# Patient Record
Sex: Female | Born: 1935 | Race: White | Hispanic: No | Smoking: Former smoker
Health system: Southern US, Community
[De-identification: ages and names within clinical notes are randomized; demographics above are authoritative.]

## PROBLEM LIST (undated history)

## (undated) DIAGNOSIS — I350 Nonrheumatic aortic (valve) stenosis: Secondary | ICD-10-CM

## (undated) DIAGNOSIS — G4730 Sleep apnea, unspecified: Secondary | ICD-10-CM

## (undated) DIAGNOSIS — R350 Frequency of micturition: Secondary | ICD-10-CM

## (undated) DIAGNOSIS — R32 Unspecified urinary incontinence: Secondary | ICD-10-CM

## (undated) DIAGNOSIS — L309 Dermatitis, unspecified: Secondary | ICD-10-CM

## (undated) DIAGNOSIS — F32A Depression, unspecified: Secondary | ICD-10-CM

## (undated) DIAGNOSIS — R351 Nocturia: Secondary | ICD-10-CM

## (undated) DIAGNOSIS — M2550 Pain in unspecified joint: Secondary | ICD-10-CM

## (undated) DIAGNOSIS — R3915 Urgency of urination: Secondary | ICD-10-CM

## (undated) DIAGNOSIS — L409 Psoriasis, unspecified: Secondary | ICD-10-CM

## (undated) DIAGNOSIS — I10 Essential (primary) hypertension: Secondary | ICD-10-CM

## (undated) DIAGNOSIS — M1990 Unspecified osteoarthritis, unspecified site: Secondary | ICD-10-CM

## (undated) DIAGNOSIS — K259 Gastric ulcer, unspecified as acute or chronic, without hemorrhage or perforation: Secondary | ICD-10-CM

## (undated) DIAGNOSIS — S2232XA Fracture of one rib, left side, initial encounter for closed fracture: Secondary | ICD-10-CM

## (undated) DIAGNOSIS — R0600 Dyspnea, unspecified: Secondary | ICD-10-CM

## (undated) DIAGNOSIS — F329 Major depressive disorder, single episode, unspecified: Secondary | ICD-10-CM

## (undated) DIAGNOSIS — M2540 Effusion, unspecified joint: Secondary | ICD-10-CM

## (undated) HISTORY — DX: Morbid (severe) obesity due to excess calories: E66.01

## (undated) HISTORY — DX: Fracture of one rib, left side, initial encounter for closed fracture: S22.32XA

## (undated) HISTORY — DX: Dyspnea, unspecified: R06.00

## (undated) HISTORY — PX: ESOPHAGOGASTRODUODENOSCOPY: SHX1529

## (undated) HISTORY — DX: Nonrheumatic aortic (valve) stenosis: I35.0

## (undated) HISTORY — DX: Major depressive disorder, single episode, unspecified: F32.9

## (undated) HISTORY — PX: OTHER SURGICAL HISTORY: SHX169

## (undated) HISTORY — PX: COLONOSCOPY: SHX174

## (undated) HISTORY — DX: Depression, unspecified: F32.A

## (undated) HISTORY — PX: DENTAL SURGERY: SHX609

## (undated) HISTORY — PX: TONSILLECTOMY: SUR1361

## (undated) HISTORY — DX: Unspecified osteoarthritis, unspecified site: M19.90

## (undated) HISTORY — PX: JOINT REPLACEMENT: SHX530

---

## 2010-04-07 ENCOUNTER — Emergency Department (HOSPITAL_COMMUNITY): Admission: EM | Admit: 2010-04-07 | Discharge: 2010-04-07 | Payer: Self-pay | Admitting: Emergency Medicine

## 2010-07-30 ENCOUNTER — Encounter: Payer: Self-pay | Admitting: Cardiology

## 2010-09-05 ENCOUNTER — Encounter: Payer: Self-pay | Admitting: Cardiology

## 2010-09-16 DIAGNOSIS — R5383 Other fatigue: Secondary | ICD-10-CM

## 2010-09-16 DIAGNOSIS — H9190 Unspecified hearing loss, unspecified ear: Secondary | ICD-10-CM

## 2010-09-16 DIAGNOSIS — R011 Cardiac murmur, unspecified: Secondary | ICD-10-CM | POA: Insufficient documentation

## 2010-09-16 DIAGNOSIS — S81809A Unspecified open wound, unspecified lower leg, initial encounter: Secondary | ICD-10-CM

## 2010-09-16 DIAGNOSIS — M1990 Unspecified osteoarthritis, unspecified site: Secondary | ICD-10-CM | POA: Insufficient documentation

## 2010-09-16 DIAGNOSIS — R5381 Other malaise: Secondary | ICD-10-CM

## 2010-09-16 DIAGNOSIS — M129 Arthropathy, unspecified: Secondary | ICD-10-CM | POA: Insufficient documentation

## 2010-09-16 DIAGNOSIS — E559 Vitamin D deficiency, unspecified: Secondary | ICD-10-CM | POA: Insufficient documentation

## 2010-09-16 DIAGNOSIS — S81009A Unspecified open wound, unspecified knee, initial encounter: Secondary | ICD-10-CM | POA: Insufficient documentation

## 2010-09-16 DIAGNOSIS — K5900 Constipation, unspecified: Secondary | ICD-10-CM | POA: Insufficient documentation

## 2010-09-16 DIAGNOSIS — S91009A Unspecified open wound, unspecified ankle, initial encounter: Secondary | ICD-10-CM

## 2010-09-16 DIAGNOSIS — R12 Heartburn: Secondary | ICD-10-CM

## 2010-09-17 ENCOUNTER — Encounter: Payer: Self-pay | Admitting: Cardiology

## 2010-09-17 ENCOUNTER — Ambulatory Visit: Payer: Self-pay | Admitting: Cardiology

## 2010-09-17 DIAGNOSIS — I359 Nonrheumatic aortic valve disorder, unspecified: Secondary | ICD-10-CM | POA: Insufficient documentation

## 2010-09-30 ENCOUNTER — Ambulatory Visit: Payer: Self-pay

## 2010-09-30 ENCOUNTER — Encounter: Payer: Self-pay | Admitting: Cardiology

## 2010-09-30 ENCOUNTER — Ambulatory Visit (HOSPITAL_COMMUNITY)
Admission: RE | Admit: 2010-09-30 | Discharge: 2010-09-30 | Payer: Self-pay | Source: Home / Self Care | Attending: Cardiology | Admitting: Cardiology

## 2010-10-19 HISTORY — PX: OTHER SURGICAL HISTORY: SHX169

## 2010-10-21 ENCOUNTER — Ambulatory Visit
Admission: RE | Admit: 2010-10-21 | Discharge: 2010-10-21 | Payer: Self-pay | Source: Home / Self Care | Attending: Cardiology | Admitting: Cardiology

## 2010-10-21 ENCOUNTER — Encounter: Payer: Self-pay | Admitting: Cardiology

## 2010-10-21 ENCOUNTER — Encounter (INDEPENDENT_AMBULATORY_CARE_PROVIDER_SITE_OTHER): Payer: Self-pay | Admitting: *Deleted

## 2010-10-22 ENCOUNTER — Ambulatory Visit: Admit: 2010-10-22 | Payer: Self-pay | Admitting: Cardiology

## 2010-10-22 ENCOUNTER — Encounter: Admission: RE | Admit: 2010-10-22 | Payer: Self-pay | Source: Home / Self Care | Admitting: Orthopedic Surgery

## 2010-10-23 ENCOUNTER — Ambulatory Visit
Admission: RE | Admit: 2010-10-23 | Payer: Self-pay | Source: Home / Self Care | Attending: Cardiology | Admitting: Cardiology

## 2010-10-23 LAB — BASIC METABOLIC PANEL
BUN: 14 mg/dL (ref 6–23)
CO2: 29 mEq/L (ref 19–32)
Calcium: 9 mg/dL (ref 8.4–10.5)
Chloride: 104 mEq/L (ref 96–112)
Creatinine, Ser: 0.74 mg/dL (ref 0.4–1.2)
GFR calc Af Amer: >60 mL/min (ref >60)
GFR calc non Af Amer: >60 mL/min (ref >60)
Glucose, Bld: 98 mg/dL (ref 70–99)
Potassium: 4.2 mEq/L (ref 3.5–5.1)
Sodium: 142 mEq/L (ref 135–145)

## 2010-10-23 LAB — POCT I-STAT 3, VENOUS BLOOD GAS (G3P V)
Acid-Base Excess: 2 mmol/L (ref 0.0–2.0)
Bicarbonate: 29 mEq/L — ABNORMAL HIGH (ref 20.0–24.0)
O2 Saturation: 63 %
TCO2: 31 mmol/L (ref 0–100)
pCO2, Ven: 52.2 mmHg — ABNORMAL HIGH (ref 45.0–50.0)
pH, Ven: 7.352 — ABNORMAL HIGH (ref 7.250–7.300)
pO2, Ven: 35 mmHg (ref 30.0–45.0)

## 2010-10-23 LAB — PROTIME-INR
INR: 0.96 (ref 0.00–1.49)
Prothrombin Time: 13 seconds (ref 11.6–15.2)

## 2010-10-23 LAB — POCT I-STAT 3, ART BLOOD GAS (G3+)
Acid-Base Excess: 3 mmol/L — ABNORMAL HIGH (ref 0.0–2.0)
Bicarbonate: 28.6 mEq/L — ABNORMAL HIGH (ref 20.0–24.0)
O2 Saturation: 92 %
TCO2: 30 mmol/L (ref 0–100)
pCO2 arterial: 47.5 mmHg — ABNORMAL HIGH (ref 35.0–45.0)
pH, Arterial: 7.388 (ref 7.350–7.400)
pO2, Arterial: 64 mmHg — ABNORMAL LOW (ref 80.0–100.0)

## 2010-10-23 LAB — CBC
HCT: 47.9 % — ABNORMAL HIGH (ref 36.0–46.0)
Hemoglobin: 16.1 g/dL — ABNORMAL HIGH (ref 12.0–15.0)
MCH: 32.1 pg (ref 26.0–34.0)
MCHC: 33.6 g/dL (ref 30.0–36.0)
MCV: 95.4 fL (ref 78.0–100.0)
Platelets: 181 10*3/uL (ref 150–400)
RBC: 5.02 MIL/uL (ref 3.87–5.11)
RDW: 13.7 % (ref 11.5–15.5)
WBC: 8 10*3/uL (ref 4.0–10.5)

## 2010-10-28 ENCOUNTER — Ambulatory Visit
Admission: RE | Admit: 2010-10-28 | Discharge: 2010-10-28 | Payer: Self-pay | Source: Home / Self Care | Attending: Thoracic Surgery (Cardiothoracic Vascular Surgery) | Admitting: Thoracic Surgery (Cardiothoracic Vascular Surgery)

## 2010-11-06 ENCOUNTER — Encounter
Admission: AD | Admit: 2010-11-06 | Discharge: 2010-11-06 | Payer: Self-pay | Source: Home / Self Care | Attending: Dentistry | Admitting: Dentistry

## 2010-11-06 ENCOUNTER — Encounter: Payer: Self-pay | Admitting: Cardiology

## 2010-11-10 ENCOUNTER — Telehealth (INDEPENDENT_AMBULATORY_CARE_PROVIDER_SITE_OTHER): Payer: Self-pay | Admitting: *Deleted

## 2010-11-10 ENCOUNTER — Inpatient Hospital Stay: Admission: RE | Admit: 2010-11-10 | Payer: Self-pay | Source: Home / Self Care | Admitting: Orthopedic Surgery

## 2010-11-11 ENCOUNTER — Ambulatory Visit (HOSPITAL_COMMUNITY)
Admission: RE | Admit: 2010-11-11 | Discharge: 2010-11-11 | Payer: Self-pay | Source: Home / Self Care | Attending: Dentistry | Admitting: Dentistry

## 2010-11-11 LAB — CBC
HCT: 43.8 % (ref 36.0–46.0)
Hemoglobin: 14.5 g/dL (ref 12.0–15.0)
MCH: 31.5 pg (ref 26.0–34.0)
MCHC: 33.1 g/dL (ref 30.0–36.0)
MCV: 95 fL (ref 78.0–100.0)
Platelets: 180 10*3/uL (ref 150–400)
RBC: 4.61 MIL/uL (ref 3.87–5.11)
RDW: 13.5 % (ref 11.5–15.5)
WBC: 6.6 10*3/uL (ref 4.0–10.5)

## 2010-11-11 LAB — BASIC METABOLIC PANEL
BUN: 15 mg/dL (ref 6–23)
CO2: 30 mEq/L (ref 19–32)
Calcium: 8.9 mg/dL (ref 8.4–10.5)
Chloride: 102 mEq/L (ref 96–112)
Creatinine, Ser: 0.85 mg/dL (ref 0.4–1.2)
GFR calc Af Amer: >60 mL/min (ref >60)
GFR calc non Af Amer: >60 mL/min (ref >60)
Glucose, Bld: 94 mg/dL (ref 70–99)
Potassium: 4.5 mEq/L (ref 3.5–5.1)
Sodium: 141 mEq/L (ref 135–145)

## 2010-11-11 NOTE — Op Note (Addendum)
NAMESUKHMAN, KOCHER               ACCOUNT NO.:  [id number]  MEDICAL RECORD NO.:  [id number]          PATIENT TYPE:  AMB  LOCATION:  SDS                          FACILITY:  [facility]  PHYSICIAN:  Charlynne Pander, D.D.S.DATE OF BIRTH:  [birth year]  DATE OF PROCEDURE:  11/11/2010 DATE OF DISCHARGE:                              OPERATIVE REPORT   PREOPERATIVE DIAGNOSES: 1. Critical aortic stenosis. 2. Preaortic valve replacement dental protocol. 3. Chronic periodontitis. 4. Multiple retained root segments. 5. Apical periodontitis.  POSTOPERATIVE DIAGNOSES: 1. Critical aortic stenosis. 2. Preaortic valve replacement dental protocol. 3. Chronic periodontitis. 4. Multiple retained root segments. 5. Apical periodontitis. 6. Bilateral mandibular lingual tori.  OPERATIONS: 1. Extraction of remaining teeth (tooth numbers 3, 5, 6, 7,10, 11, 12,     17, 18, 19, 20, 21, 22, 27, 28, 29, 30, 31, and 32). 2. Four quadrants alveoloplasty. 3. Bilateral mandibular lingual tori reductions.  SURGEON:  Charlynne Pander, D.D.S.  ASSISTANT:  Public house manager (Sales executive).  ANESTHESIA:  Monitored anesthesia care per the Anesthesia Team Dr. Dahlia Client attending.  MEDICATIONS: 1. Ancef 1 g IV prior to invasive dental procedures. 2. Local anesthesia with a total utilization of four carpules each     containing 34 mg of lidocaine with 0.017 mg of epinephrine as well     as three carpules each containing 9 mg of bupivacaine with 0.009 mg     of epinephrine.  SPECIMENS:  There were 19 teeth that were discarded.  DRAINS:  None.  CULTURES:  None.  ESTIMATED BLOOD LOSS:  Less than 100 mL.  FLUIDS:  500 mL of lactated Ringer solution.  COMPLICATIONS:  None.  INDICATIONS:  The patient was previously diagnosed with critical aortic stenosis with anticipated aortic valve replacement.  The patient was seen as part of her preaortic valve replacement dental protocol evaluation.  The  patient was examined and treatment planned for extraction of remaining teeth with alveoloplasty and preprosthetic surgery as indicated.  This treatment plan was formulated to decrease the risk and complications associated with dental infection from affecting the patient's systemic health and anticipated heart valve surgery.  OPERATIVE FINDINGS:  The patient was examined in operating room #32 in the neuro surgical suite.  The teeth were identified for extraction. The patient noted to be affected by chronic periodontitis, apical periodontitis, multiple retained root segments in the presence of bilateral mandibular lingual tori.  DESCRIPTION OF PROCEDURE:  The patient was brought to the main operating room #32.  The patient was then placed in supine position on the operating room table.  Monitored anesthesia care was then induced by the anesthesia team.  The area was then prepped and draped in the usual manner for dental medicine procedure.  Time-out was performed.  The patient was identified, procedures were verified.  The oral cavity was then thoroughly examined with findings noted above.  The patient was then ready for the dental medicine procedure as follows:  Local anesthesia was administered sequentially with a total utilization of four carpules each containing 34 mg of lidocaine with 0.017 mg of epinephrine as well as three carpules each containing 9  mg of bupivacaine with 0.009 mg of epinephrine.  Maxillary left and right quadrants were first approached.  Infiltration was then achieved utilizing the lidocaine with epinephrine on the facial and palatal aspects as indicated.  The maxillary right quadrant was then approached, 15 blade incision was then made from the distal #2 and extended to the distal of #8.  Surgical flap was then carefully reflected.  The teeth were then subluxated with a series of straight elevators.  Tooth #3 was then removed with a 53R forceps without  complication.  Tooth numbers 5, 6, and 7 were then removed with 150 forceps without complications.  Alveoplasty was then performed utilizing rongeurs and bone file.  Tissues were approximated and trimmed appropriately.  The surgical site was then irrigated with copious amounts of sterile saline.  The surgical site was then closed in the distal #2 and extended to the mesial #8 utilizing 3-0 chromic gut suture in a continuous interrupted suture technique x1.  At this point in time, the maxillary left quadrant was approached, 15 blade incision was then made from the distal #13 extended to the mesial #9.  Surgical flap was then carefully reflected.  Teeth were subluxated with a series of straight elevators.  Tooth numbers 10, 11, and 12 were then removed with a 150 forceps without complications.  Alveoplasty was then performed utilizing rongeurs and bone file.  The tissues were approximated and trimmed appropriately.  Surgical site was then irrigated with copious amounts of sterile saline.  The surgical site was then closed from distal #13 and extended to the mesial #9 utilizing 3-0 chromic gut suture in a continuous interrupted suture technique x1.  At this point in time, the mandibular quadrants were approached.  The patient was given bilateral inferior alveolar nerve blocks utilizing the bupivacaine with epinephrine.  Further infiltration was then achieved utilizing the bupivacaine with epinephrine.  At this point in time, a 15 blade incision was then made from the distal of #32 and extended to the mesial of #22.  Surgical flap was then carefully reflected.  Appropriate amounts of buccal and interseptal bone was removed with the surgical handpiece and bur, and copious amounts of sterile saline.  Teeth were then subluxated with a series of straight elevators.  Tooth #32 was then removed with a #23 forceps without complications.  The roots associated with tooth numbers 29, 30, and 31 were  then removed with a 151 forceps without complications.  Tooth numbers 27 and 28 were then removed with a 151 forceps without complications.  Alveoplasty was then performed utilizing rongeurs and bone file.  At this point in time, mandibular torus was noted and this was removed with a surgical handpiece and bur, and copious amounts of sterile saline.  Further alveoloplasty was then achieved utilizing rongeurs and bone file.  The tissues were approximated and trimmed appropriately.  Surgical site was then irrigated with copious amounts of sterile saline x2.  The surgical site was then closed from distal #32 and extended to the mesial of #25 utilizing 3-0 chromic gut suture in a continuous interrupted suture technique x1.  At this point in time, the mandibular left quadrant was approached.  A 15 blade incision was then made from the distal #17, extended to the mesial #22, surgical flap was then further reflected.  Appropriate amounts of buccal and interseptal bone was removed with the surgical handpiece and bur, and copious amounts of sterile saline.  Teeth were then subluxated with a series of straight elevators.  Tooth numbers 22, 21, and 20 were then removed with a 151 forceps without complications. Tooth #17 and #19 were then removed with a 17 forceps without complications.  The retained root in the area of #18 was then removed with rongeurs without complication.  Alveoloplasty was then performed utilizing rongeurs and bone file.  The mandibular left torus was then removed with a surgical handpiece and bur, and copious amounts of sterile saline.  Further alveoplasty was then performed utilizing rongeurs and bone file.  Tissues were approximated and trimmed appropriately.  The surgical site was then irrigated with copious amounts of sterile saline x2.  The surgical site was then closed from the distal #17, extended to the mesial #20 for utilizing 3-0 chromic gut suture in a continuous  interrupted suture technique x1.  At this point in time, the entire mouth was irrigated with copious amounts of sterile saline.  The patient was examined for complications, seeing none, dental medicine procedure was deemed to be complete.  A series of 4 x 4 gauzes were placed to the mouth to aid hemostasis.  The patient was then handed over the anesthesia team for final disposition. After appropriate amount of time, the patient was taken to the postanesthesia care unit with stable vital signs and good oxygenation level.  All counts were correct for the dental medicine procedure.  The patient will be seen in approximately 1 week for evaluation for suture removal.  Aortic valve replacement surgery will be scheduled at the discretion of Dr. Dorris Fetch.  The patient was given appropriate pain medication utilizing Percocet 5/325, one to two tablets every 6 hours as needed for pain.     Charlynne Pander, D.D.S.     RFK/MEDQ  D:  11/11/2010  T:  11/11/2010  Job:  [id number]  cc:   Salvatore Decent. Dorris Fetch, M.D. Madolyn Frieze Jens Som, MD, [facility] Madlyn Frankel. Charlann Boxer, M.D.  Electronically Signed by Cindra Eves D.D.S. on 11/11/2010 11:32:06 AM

## 2010-11-13 NOTE — Procedures (Addendum)
  NAMEVEE, BAHE               ACCOUNT NO.:  [id number]  MEDICAL RECORD NO.:  [id number]          PATIENT TYPE:  OIB  LOCATION:  1963                         FACILITY:  [facility]  PHYSICIAN:  Rollene Rotunda, MD, FACCDATE OF BIRTH:  12-Jan-1935  DATE OF PROCEDURE:  10/23/2010 DATE OF DISCHARGE:  10/23/2010                           CARDIAC CATHETERIZATION   PRIMARY CARE DOCTOR:  Dr. Charlesetta Shanks.  CARDIOLOGIST:  Madolyn Frieze. Jens Som, MD, FACC  SURGEON:  Rollene Rotunda, MD, [facility]  REASON FOR CATHETERIZATION:  Evaluate the patient with severe aortic stenosis.  PROCEDURE NOTE:  Left and right heart catheterization was performed via the right femoral artery.  The vessel was cannulated using anterior wall puncture.  A #5-French arterial sheath and a #7-French venous sheath were inserted via the modified Seldinger technique.  Preformed Judkins and pigtail catheter were utilized.  The patient tolerated the procedure well and left the lab in stable condition.  RESULTS:  RA mean 5, RV 29/2, PA August 28/9 with a mean of 18, pulmonary wedge pressure mean 6, LV 195/22, AO 156/75, cardiac output/cardiac index 4/2, mean gradient 34.97, aortic valve area 0.58.  Coronaries:  Left main was normal.  The LAD had luminal irregularities. There was mid diagonal which was large and normal.  Circumflex in the AV groove was normal.  The first obtuse marginal was small and normal. Second obtuse marginal was moderate sized and normal.  The right coronary artery was a large dominant vessel with mild luminal irregularities.  PDA was moderate sized and normal.  The left ventriculogram was obtained in the RAO projection.  The EF was 65% and normal.  CONCLUSION: 1. Severe aortic stenosis. 2. Minimal coronary plaque. 3. Normal left ventricular function.  PLAN:  The patient will be referred for aortic valve replacement.     Rollene Rotunda, MD, [facility]     JH/MEDQ  D:  10/23/2010  T:  10/23/2010  Job:   [id number]  cc:   Lynann Bologna Jens Som, MD, [facility]  Electronically Signed by Rollene Rotunda MD [facility] on 11/13/2010 12:20:24 PM

## 2010-11-17 ENCOUNTER — Ambulatory Visit
Admission: RE | Admit: 2010-11-17 | Discharge: 2010-11-17 | Payer: Self-pay | Source: Home / Self Care | Attending: Thoracic Surgery (Cardiothoracic Vascular Surgery) | Admitting: Thoracic Surgery (Cardiothoracic Vascular Surgery)

## 2010-11-17 ENCOUNTER — Telehealth (INDEPENDENT_AMBULATORY_CARE_PROVIDER_SITE_OTHER): Payer: Self-pay | Admitting: *Deleted

## 2010-11-17 LAB — COMPREHENSIVE METABOLIC PANEL
ALT: 27 U/L (ref 0–35)
AST: 30 U/L (ref 0–37)
Albumin: 3.8 g/dL (ref 3.5–5.2)
Alkaline Phosphatase: 61 U/L (ref 39–117)
BUN: 15 mg/dL (ref 6–23)
CO2: 21 mEq/L (ref 19–32)
Calcium: 9 mg/dL (ref 8.4–10.5)
Chloride: 102 mEq/L (ref 96–112)
Creatinine, Ser: 0.92 mg/dL (ref 0.4–1.2)
GFR calc Af Amer: >60 mL/min (ref >60)
GFR calc non Af Amer: 60 mL/min — ABNORMAL LOW (ref >60)
Glucose, Bld: 102 mg/dL — ABNORMAL HIGH (ref 70–99)
Potassium: 4 mEq/L (ref 3.5–5.1)
Sodium: 137 mEq/L (ref 135–145)
Total Bilirubin: 1 mg/dL (ref 0.3–1.2)
Total Protein: 7.6 g/dL (ref 6.0–8.3)

## 2010-11-17 LAB — URINALYSIS, ROUTINE W REFLEX MICROSCOPIC
Hgb urine dipstick: NEGATIVE
Ketones, ur: 15 mg/dL — AB
Leukocytes, UA: NEGATIVE
Nitrite: NEGATIVE
Protein, ur: 30 mg/dL — AB
Specific Gravity, Urine: 1.024 (ref 1.005–1.030)
Urine Glucose, Fasting: NEGATIVE mg/dL
Urobilinogen, UA: 1 mg/dL (ref 0.0–1.0)
pH: 5.5 (ref 5.0–8.0)

## 2010-11-17 LAB — BLOOD GAS, ARTERIAL
Acid-Base Excess: 1.3 mmol/L (ref 0.0–2.0)
Bicarbonate: 25.3 mEq/L — ABNORMAL HIGH (ref 20.0–24.0)
Drawn by: 181601
FIO2: 0.21 %
O2 Saturation: 96.8 %
Patient temperature: 98.6
TCO2: 26.5 mmol/L (ref 0–100)
pCO2 arterial: 39.4 mmHg (ref 35.0–45.0)
pH, Arterial: 7.423 — ABNORMAL HIGH (ref 7.350–7.400)
pO2, Arterial: 78 mmHg — ABNORMAL LOW (ref 80.0–100.0)

## 2010-11-17 LAB — CBC
HCT: 44.9 % (ref 36.0–46.0)
Hemoglobin: 15.1 g/dL — ABNORMAL HIGH (ref 12.0–15.0)
MCH: 31.1 pg (ref 26.0–34.0)
MCHC: 33.6 g/dL (ref 30.0–36.0)
MCV: 92.6 fL (ref 78.0–100.0)
Platelets: 205 10*3/uL (ref 150–400)
RBC: 4.85 MIL/uL (ref 3.87–5.11)
RDW: 13.5 % (ref 11.5–15.5)
WBC: 7.9 10*3/uL (ref 4.0–10.5)

## 2010-11-17 LAB — URINE MICROSCOPIC-ADD ON

## 2010-11-17 LAB — PROTIME-INR
INR: 1.04 (ref 0.00–1.49)
Prothrombin Time: 13.8 seconds (ref 11.6–15.2)

## 2010-11-17 LAB — TYPE AND SCREEN
ABO/RH(D): O POS
Antibody Screen: NEGATIVE

## 2010-11-17 LAB — HEMOGLOBIN A1C
Hgb A1c MFr Bld: 5.3 % (ref <5.7)
Mean Plasma Glucose: 105 mg/dL (ref <117)

## 2010-11-17 LAB — APTT: aPTT: 24 seconds (ref 24–37)

## 2010-11-17 LAB — SURGICAL PCR SCREEN
MRSA, PCR: NEGATIVE
Staphylococcus aureus: POSITIVE — AB

## 2010-11-17 LAB — ABO/RH: ABO/RH(D): O POS

## 2010-11-18 NOTE — Letter (Signed)
Summary: [name] - History & Physical  [facility] - History & Physical   Imported By: Marylou Mccoy 09/23/2010 15:06:34  _____________________________________________________________________  External Attachment:    Type:   Image     Comment:   External Document

## 2010-11-18 NOTE — Assessment & Plan Note (Signed)
Summary: np6/heart murmur-mb   CC:  referal from Dr. Celene Skeen.  History of Present Illness: 76 year old female for evaluation of murmur. No prior cardiac history. Pt denies chest pain, orthopnea, PND, palpitations or syncope. She has mild dyspnea with more extreme activities relieved with rest. There is occasional mild pedal edema. We are asked to evaluate for murmur.  Current Medications (verified): 1)  None  Allergies: 1)  ! * Bee Stings  Past History:  Past Medical History: VITAMIN D DEFICIENCY OSTEOARTHRITIS  MORBID OBESITY Fracture of left rib  Past Surgical History: Cataract Tonsillectomy Ovary removed secondary to cyst  Family History: Reviewed history from 09/16/2010 and no changes required. Cancer DM No premature CAD  Social History: Reviewed history from 09/16/2010 and no changes required. Retired  Tobacco Use - Former.  Alcohol Use - yes Regular Exercise - yes Widowed   Review of Systems       Knee Arthralgias butno fevers or chills, productive cough, hemoptysis, dysphasia, odynophagia, melena, hematochezia, dysuria, hematuria, rash, seizure activity, orthopnea, PND, pedal edema, claudication. Remaining systems are negative.   Vital Signs:  Patient profile:   76 year old female Height:      62 inches Weight:      220 pounds BMI:     40.38 Pulse rate:   89 / minute Resp:     14 per minute BP sitting:   134 / 83  (left arm)  Vitals Entered By: Kem Parkinson (September 17, 2010 2:47 PM)  Physical Exam  General:  Well developed/obese in NAD Skin warm/dry Patient not depressed No peripheral clubbing Back-normal HEENT-normal/normal eyelids Neck supple/normal carotid upstroke bilaterally; no bruits; no JVD; no thyromegaly chest - CTA/ normal expansion CV - RRR/normal S1 and S2; no  rubs or gallops;  PMI nondisplaced; 2/6 systolic murmur left sternal border. S2 is not diminished. Abdomen -NT/ND, no HSM, no mass, + bowel sounds, no bruit 2+  femoral pulses, no bruits Ext-no edema, chords, 2+ DP; Varicosities noted. Neuro-grossly nonfocal     EKG  Procedure date:  09/17/2010  Findings:      Sinus rhythm with first degree AV block.  Impression & Recommendations:  Problem # 1:  MURMUR (ICD-785.2) Patient sounds to have aortic stenosis murmur although mild. She was instructed on symptoms of dyspnea, chest pain syncope for future reference.. Orders: Echocardiogram (Echo)  Problem # 2:  MORBID OBESITY (ICD-278.01)  Problem # 3:  AORTIC VALVE DISORDERS (ICD-424.1) As per #1.  Patient Instructions: 1)  Your physician wants you to follow-up in:ONE YEAR   You will receive a reminder letter in the mail two months in advance. If you don't receive a letter, please call our office to schedule the follow-up appointment. 2)  Your physician has requested that you have an echocardiogram.  Echocardiography is a painless test that uses sound waves to create images of your heart. It provides your doctor with information about the size and shape of your heart and how well your heart's chambers and valves are working.  This procedure takes approximately one hour. There are no restrictions for this procedure.

## 2010-11-19 ENCOUNTER — Inpatient Hospital Stay (HOSPITAL_COMMUNITY): Payer: Medicare HMO

## 2010-11-19 ENCOUNTER — Other Ambulatory Visit: Payer: Self-pay | Admitting: Thoracic Surgery (Cardiothoracic Vascular Surgery)

## 2010-11-19 ENCOUNTER — Inpatient Hospital Stay (HOSPITAL_COMMUNITY)
Admission: RE | Admit: 2010-11-19 | Discharge: 2010-11-24 | DRG: 220 | Disposition: A | Discharge status: Home / Self Care | Payer: Medicare HMO | Source: Ambulatory Visit | Attending: Thoracic Surgery (Cardiothoracic Vascular Surgery) | Admitting: Thoracic Surgery (Cardiothoracic Vascular Surgery)

## 2010-11-19 DIAGNOSIS — Z7982 Long term (current) use of aspirin: Secondary | ICD-10-CM

## 2010-11-19 DIAGNOSIS — I359 Nonrheumatic aortic valve disorder, unspecified: Principal | ICD-10-CM | POA: Diagnosis present

## 2010-11-19 DIAGNOSIS — E559 Vitamin D deficiency, unspecified: Secondary | ICD-10-CM | POA: Diagnosis present

## 2010-11-19 DIAGNOSIS — D696 Thrombocytopenia, unspecified: Secondary | ICD-10-CM | POA: Diagnosis not present

## 2010-11-19 DIAGNOSIS — E8779 Other fluid overload: Secondary | ICD-10-CM | POA: Diagnosis not present

## 2010-11-19 DIAGNOSIS — M1990 Unspecified osteoarthritis, unspecified site: Secondary | ICD-10-CM | POA: Diagnosis present

## 2010-11-19 DIAGNOSIS — D62 Acute posthemorrhagic anemia: Secondary | ICD-10-CM | POA: Diagnosis not present

## 2010-11-19 HISTORY — PX: ANOMALOUS PULMONARY VENOUS RETURN REPAIR: SHX255

## 2010-11-19 LAB — POCT I-STAT 3, ART BLOOD GAS (G3+)
Acid-base deficit: 2 mmol/L (ref 0.0–2.0)
Bicarbonate: 22.9 mEq/L (ref 20.0–24.0)
O2 Saturation: 96 %
O2 Saturation: 97 %
TCO2: 21 mmol/L (ref 0–100)
pCO2 arterial: 32.9 mmHg — ABNORMAL LOW (ref 35.0–45.0)
pCO2 arterial: 44.7 mmHg (ref 35.0–45.0)
pCO2 arterial: 40.8 mmHg (ref 35.0–45.0)
pH, Arterial: 7.45 — ABNORMAL HIGH (ref 7.350–7.400)
pH, Arterial: 7.283 — ABNORMAL LOW (ref 7.350–7.400)
pO2, Arterial: 106 mmHg — ABNORMAL HIGH (ref 80.0–100.0)

## 2010-11-19 LAB — POCT I-STAT 4, (NA,K, GLUC, HGB,HCT)
Glucose, Bld: 111 mg/dL — ABNORMAL HIGH (ref 70–99)
Glucose, Bld: 100 mg/dL — ABNORMAL HIGH (ref 70–99)
Glucose, Bld: 82 mg/dL (ref 70–99)
Glucose, Bld: 93 mg/dL (ref 70–99)
Glucose, Bld: 109 mg/dL — ABNORMAL HIGH (ref 70–99)
HCT: 37 % (ref 36.0–46.0)
HCT: 34 % — ABNORMAL LOW (ref 36.0–46.0)
HCT: 26 % — ABNORMAL LOW (ref 36.0–46.0)
HCT: 24 % — ABNORMAL LOW (ref 36.0–46.0)
Hemoglobin: 12.6 g/dL (ref 12.0–15.0)
Hemoglobin: 8.5 g/dL — ABNORMAL LOW (ref 12.0–15.0)
Hemoglobin: 8.8 g/dL — ABNORMAL LOW (ref 12.0–15.0)
Hemoglobin: 8.2 g/dL — ABNORMAL LOW (ref 12.0–15.0)
Potassium: 3.8 mEq/L (ref 3.5–5.1)
Potassium: 3.5 mEq/L (ref 3.5–5.1)
Potassium: 4 mEq/L (ref 3.5–5.1)
Sodium: 140 mEq/L (ref 135–145)
Sodium: 140 mEq/L (ref 135–145)
Sodium: 137 mEq/L (ref 135–145)

## 2010-11-19 LAB — CBC
HCT: 34.9 % — ABNORMAL LOW (ref 36.0–46.0)
Hemoglobin: 11.5 g/dL — ABNORMAL LOW (ref 12.0–15.0)
Hemoglobin: 10.9 g/dL — ABNORMAL LOW (ref 12.0–15.0)
MCH: 30.8 pg (ref 26.0–34.0)
MCHC: 33 g/dL (ref 30.0–36.0)
MCHC: 32.2 g/dL (ref 30.0–36.0)
MCV: 93.6 fL (ref 78.0–100.0)
Platelets: 96 10*3/uL — ABNORMAL LOW (ref 150–400)
RBC: 3.73 MIL/uL — ABNORMAL LOW (ref 3.87–5.11)
RBC: 3.54 MIL/uL — ABNORMAL LOW (ref 3.87–5.11)
RDW: 13.6 % (ref 11.5–15.5)
WBC: 13.7 10*3/uL — ABNORMAL HIGH (ref 4.0–10.5)
WBC: 14.6 10*3/uL — ABNORMAL HIGH (ref 4.0–10.5)

## 2010-11-19 LAB — POCT I-STAT, CHEM 8
Glucose, Bld: 119 mg/dL — ABNORMAL HIGH (ref 70–99)
HCT: 33 % — ABNORMAL LOW (ref 36.0–46.0)
Hemoglobin: 11.2 g/dL — ABNORMAL LOW (ref 12.0–15.0)
Potassium: 4.9 mEq/L (ref 3.5–5.1)
Sodium: 141 mEq/L (ref 135–145)

## 2010-11-19 LAB — PLATELET COUNT: Platelets: 114 10*3/uL — ABNORMAL LOW (ref 150–400)

## 2010-11-19 LAB — GLUCOSE, CAPILLARY
Glucose-Capillary: 91 mg/dL (ref 70–99)
Glucose-Capillary: 74 mg/dL (ref 70–99)
Glucose-Capillary: 76 mg/dL (ref 70–99)
Glucose-Capillary: 93 mg/dL (ref 70–99)

## 2010-11-19 LAB — CREATININE, SERUM
Creatinine, Ser: 0.85 mg/dL (ref 0.4–1.2)
GFR calc Af Amer: >60 mL/min (ref >60)
GFR calc non Af Amer: >60 mL/min (ref >60)

## 2010-11-19 LAB — PROTIME-INR: INR: 1.56 — ABNORMAL HIGH (ref 0.00–1.49)

## 2010-11-19 LAB — HEMOGLOBIN AND HEMATOCRIT, BLOOD
HCT: 26 % — ABNORMAL LOW (ref 36.0–46.0)
Hemoglobin: 8.8 g/dL — ABNORMAL LOW (ref 12.0–15.0)

## 2010-11-19 LAB — APTT: aPTT: 41 seconds — ABNORMAL HIGH (ref 24–37)

## 2010-11-20 ENCOUNTER — Inpatient Hospital Stay (HOSPITAL_COMMUNITY): Payer: Medicare HMO

## 2010-11-20 ENCOUNTER — Encounter: Payer: Self-pay | Admitting: Cardiology

## 2010-11-20 DIAGNOSIS — I359 Nonrheumatic aortic valve disorder, unspecified: Secondary | ICD-10-CM

## 2010-11-20 LAB — GLUCOSE, CAPILLARY
Glucose-Capillary: 111 mg/dL — ABNORMAL HIGH (ref 70–99)
Glucose-Capillary: 96 mg/dL (ref 70–99)
Glucose-Capillary: 147 mg/dL — ABNORMAL HIGH (ref 70–99)
Glucose-Capillary: 173 mg/dL — ABNORMAL HIGH (ref 70–99)
Glucose-Capillary: 120 mg/dL — ABNORMAL HIGH (ref 70–99)

## 2010-11-20 LAB — CBC
HCT: 31.4 % — ABNORMAL LOW (ref 36.0–46.0)
Hemoglobin: 10.1 g/dL — ABNORMAL LOW (ref 12.0–15.0)
MCH: 31 pg (ref 26.0–34.0)
MCV: 96.5 fL (ref 78.0–100.0)
Platelets: 72 10*3/uL — ABNORMAL LOW (ref 150–400)
RDW: 14.6 % (ref 11.5–15.5)
WBC: 11.5 10*3/uL — ABNORMAL HIGH (ref 4.0–10.5)

## 2010-11-20 LAB — MAGNESIUM: Magnesium: 2.3 mg/dL (ref 1.5–2.5)

## 2010-11-20 LAB — POCT I-STAT, CHEM 8
BUN: 14 mg/dL (ref 6–23)
Calcium, Ion: 1.09 mmol/L — ABNORMAL LOW (ref 1.12–1.32)
Creatinine, Ser: 1.2 mg/dL (ref 0.4–1.2)
Hemoglobin: 10.2 g/dL — ABNORMAL LOW (ref 12.0–15.0)
TCO2: 28 mmol/L (ref 0–100)

## 2010-11-20 LAB — BASIC METABOLIC PANEL
CO2: 23 mEq/L (ref 19–32)
Glucose, Bld: 111 mg/dL — ABNORMAL HIGH (ref 70–99)
Potassium: 4.2 mEq/L (ref 3.5–5.1)
Sodium: 144 mEq/L (ref 135–145)

## 2010-11-20 NOTE — Cardiovascular Report (Signed)
Summary: Pre Cath Orders   Pre Cath Orders   Imported By: Roderic Ovens 11/04/2010 14:36:31  _____________________________________________________________________  External Attachment:    Type:   Image     Comment:   External Document

## 2010-11-20 NOTE — Progress Notes (Signed)
Summary: Records Request  Faxed EKG to [facility] ([phone]). Debby Freiberg  November 10, 2010 9:51 AM

## 2010-11-20 NOTE — Letter (Signed)
Summary: Cardiac Catheterization Instructions- [facility]  [employer], Main Office  [address]   [place], [place] [address]   Phone: [phone]  Fax: [phone]     10/21/2010 MRN: [id number]  [name] [address] [place], [place]  [address]  Dear [name],   You are scheduled for a Cardiac Catheterization on THURSDAY 10-23-10 with Dr. Antoine Poche  Please arrive to the 1st floor of the Heart and Vascular Center at [facility] at    6:30 am      on the day of your procedure. Please do not arrive before 6:30 a.m. Call the Heart and Vascular Center at [phone] if you are unable to make your appointmnet. The Code to get into the parking garage under the building is 0030. Take the elevators to the 1st floor. You must have someone to drive you home. Someone must be with you for the first 24 hours after you arrive home. Please wear clothes that are easy to get on and off and wear slip-on shoes. Do not eat or drink after midnight except water with your medications that morning. Bring all your medications and current insurance cards with you.  ___ DO NOT take these medications before your procedure: ________________________________________________________________  ___ Make sure you take your aspirin.  ___ You may take ALL of your medications with water that morning. ________________________________________________________________________________________________________________________________  ___ DO NOT take ANY medications before your procedure.  ___ Pre-med instructions:  ________________________________________________________________________________________________________________________________  The usual length of stay after your procedure is 2 to 3 hours. This can vary.  If you have any questions, please call the office at the number listed above.   Deliah Goody, RN

## 2010-11-20 NOTE — Assessment & Plan Note (Signed)
Summary: rov/discuss echo/dm   History of Present Illness: 76 year old female I saw in Nov of 2011 for evaluation of murmur. Echocardiogram in November of 2011 revealed normal LV function and severe aortic stenosis with a mean gradient of 44 mmHg. Since I saw her previously the patient denies any dyspnea on exertion, orthopnea, PND, pedal edema, palpitations, syncope or chest pain.   Current Medications (verified): 1)  None  Allergies: 1)  ! * Bee Stings  Past History:  Past Medical History: VITAMIN D DEFICIENCY OSTEOARTHRITIS  MORBID OBESITY Fracture of left rib Aortic stenosis  Past Surgical History: Reviewed history from 09/17/2010 and no changes required. Cataract Tonsillectomy Ovary removed secondary to cyst  Social History: Reviewed history from 09/17/2010 and no changes required. Retired  Tobacco Use - Former.  Alcohol Use - yes Regular Exercise - yes Widowed   Review of Systems       Problems with knee arthralgias but no fevers or chills, productive cough, hemoptysis, dysphasia, odynophagia, melena, hematochezia, dysuria, hematuria, rash, seizure activity, orthopnea, PND, pedal edema, claudication. Remaining systems are negative.   Vital Signs:  Patient profile:   76 year old female Height:      62 inches Weight:      233 pounds BMI:     42.77 Pulse rate:   90 / minute Resp:     14 per minute BP sitting:   140 / 94  (left arm)  Vitals Entered By: Kem Parkinson (October 21, 2010 4:06 PM)  Physical Exam  General:  Well-developed well-nourished in no acute distress.  Skin is warm and dry.  HEENT is normal.  Neck is supple. No thyromegaly.  Chest is clear to auscultation with normal expansion.  Cardiovascular exam is regular rate and rhythm. 3/6 systolic murmur left sternal border. Abdominal exam nontender or distended. No masses palpated. Extremities show no edema. neuro grossly intact    Impression & Recommendations:  Problem # 1:  AORTIC  VALVE DISORDERS (ICD-424.1) Patient has severe aortic stenosis on her echocardiogram with a mean gradient of 44 mmHg. She is not having symptoms including no dyspnea, chest pain or syncope. However she is somewhat immobile from her knee arthralgias and therefore we may be underestimating symptoms. Ordinarily I would consider repeating her echocardiogram in 6 months. However the patient is scheduled to have bilateral knee replacement. I think the risk of her surgery will be significant given the severity of her aortic stenosis. I also believe that she will most likely require aortic valve replacement in the near future regardless. We therefore agreed that she will proceed with aortic valve replacement now. We will schedule a cardiac catheterization prior to the procedure. The risks and benefits have been discussed and she agrees to proceed. She will be scheduled to see one of the surgeons for consideration of aortic valve replacement as an outpatient. Once her valve has been replaced then we can proceed with bilateral knee replacement in 4-6 months. Orders: T-2 View CXR ([id number]) TCTS Referral (TCTS Ref)  Problem # 2:  ARTHRITIS (ICD-716.90) Plan knee replacement following aortic valve replacement.  Problem # 3:  MORBID OBESITY (ICD-278.01)  Patient Instructions: 1)  Your physician recommends that you schedule a follow-up appointment in: 4 weeks 2)  You have been referred to dr clearence owen 3)  Your physician has requested that you have a cardiac catheterization.  Cardiac catheterization is used to diagnose and/or treat various heart conditions. Doctors may recommend this procedure for a number of different reasons. The  most common reason is to evaluate chest pain. Chest pain can be a symptom of coronary artery disease (CAD), and cardiac catheterization can show whether plaque is narrowing or blocking your heart's arteries. This procedure is also used to evaluate the valves, as well as measure the  blood flow and oxygen levels in different parts of your heart.  For further information please visit [email or web address]  Please follow instruction sheet, as given.

## 2010-11-21 ENCOUNTER — Inpatient Hospital Stay (HOSPITAL_COMMUNITY): Payer: Medicare HMO

## 2010-11-21 LAB — BASIC METABOLIC PANEL
CO2: 29 mEq/L (ref 19–32)
Calcium: 7.8 mg/dL — ABNORMAL LOW (ref 8.4–10.5)
Creatinine, Ser: 0.91 mg/dL (ref 0.4–1.2)
GFR calc Af Amer: >60 mL/min (ref >60)
GFR calc non Af Amer: >60 mL/min (ref >60)

## 2010-11-21 LAB — GLUCOSE, CAPILLARY: Glucose-Capillary: 105 mg/dL — ABNORMAL HIGH (ref 70–99)

## 2010-11-21 LAB — CBC
Hemoglobin: 9.4 g/dL — ABNORMAL LOW (ref 12.0–15.0)
MCH: 30.8 pg (ref 26.0–34.0)
MCHC: 31.6 g/dL (ref 30.0–36.0)
Platelets: 64 10*3/uL — ABNORMAL LOW (ref 150–400)
RDW: 14.7 % (ref 11.5–15.5)

## 2010-11-21 NOTE — Letter (Signed)
Summary: [name] History & Physical  [facility] History & Physical   Imported By: Marylou Mccoy 09/23/2010 15:08:17  _____________________________________________________________________  External Attachment:    Type:   Image     Comment:   External Document

## 2010-11-22 ENCOUNTER — Inpatient Hospital Stay (HOSPITAL_COMMUNITY): Payer: Medicare HMO

## 2010-11-22 LAB — CBC
HCT: 29.6 % — ABNORMAL LOW (ref 36.0–46.0)
Hemoglobin: 9.6 g/dL — ABNORMAL LOW (ref 12.0–15.0)
MCHC: 32.4 g/dL (ref 30.0–36.0)

## 2010-11-22 LAB — BASIC METABOLIC PANEL
CO2: 28 mEq/L (ref 19–32)
Calcium: 7.9 mg/dL — ABNORMAL LOW (ref 8.4–10.5)
Glucose, Bld: 96 mg/dL (ref 70–99)
Potassium: 4.2 mEq/L (ref 3.5–5.1)
Sodium: 140 mEq/L (ref 135–145)

## 2010-11-23 ENCOUNTER — Inpatient Hospital Stay (HOSPITAL_COMMUNITY): Payer: Medicare HMO

## 2010-11-23 LAB — BASIC METABOLIC PANEL
BUN: 11 mg/dL (ref 6–23)
CO2: 31 mEq/L (ref 19–32)
Calcium: 7.7 mg/dL — ABNORMAL LOW (ref 8.4–10.5)
Creatinine, Ser: 0.79 mg/dL (ref 0.4–1.2)
Glucose, Bld: 84 mg/dL (ref 70–99)

## 2010-11-23 LAB — CBC
HCT: 30.1 % — ABNORMAL LOW (ref 36.0–46.0)
Hemoglobin: 9.4 g/dL — ABNORMAL LOW (ref 12.0–15.0)
MCH: 30.2 pg (ref 26.0–34.0)
MCHC: 31.2 g/dL (ref 30.0–36.0)

## 2010-11-24 LAB — HEPARIN INDUCED THROMBOCYTOPENIA PNL: UFH Low Dose 0.5 IU/mL: 0 % Release

## 2010-11-26 ENCOUNTER — Encounter: Payer: Self-pay | Admitting: Cardiology

## 2010-11-26 ENCOUNTER — Ambulatory Visit (INDEPENDENT_AMBULATORY_CARE_PROVIDER_SITE_OTHER): Payer: Medicaid Other | Admitting: Cardiology

## 2010-11-26 DIAGNOSIS — I359 Nonrheumatic aortic valve disorder, unspecified: Secondary | ICD-10-CM

## 2010-11-26 DIAGNOSIS — Z954 Presence of other heart-valve replacement: Secondary | ICD-10-CM | POA: Insufficient documentation

## 2010-11-26 NOTE — Progress Notes (Signed)
Summary: Records Request  Faxed OV to [facility] at [facility] ([phone]). Debby Freiberg  November 17, 2010 12:41 PM

## 2010-11-27 ENCOUNTER — Encounter: Payer: Self-pay | Admitting: Cardiology

## 2010-11-27 NOTE — Discharge Summary (Signed)
[name], [name]               ACCOUNT NO.:  [id number]  MEDICAL RECORD NO.:  [id number]           PATIENT TYPE:  I  LOCATION:  2021                         FACILITY:  [facility]  PHYSICIAN:  Salvatore Decent. Dorris Fetch, M.D.DATE OF BIRTH:  16-May-1935  DATE OF ADMISSION:  11/19/2010 DATE OF DISCHARGE:                              DISCHARGE SUMMARY   FINAL DIAGNOSIS:  Critical aortic stenosis.  IN-HOSPITAL DIAGNOSES: 1. Acute blood loss anemia postoperatively. 2. Volume overload postoperatively. 3. Thrombocytopenia postoperatively.  SECONDARY DIAGNOSES: 1. Osteoarthritis. 2. Morbid obesity. 3. History of left rib fracture. 4. Vitamin D deficiency. 5. Status post cataract removal. 6. Status post ovary removal secondary to cyst. 7. Status post tonsillectomy.  IN-HOSPITAL OPERATIONS AND PROCEDURES:  Aortic valve replacement with a 23-mm Edwards Magna Ease bovine pericardial valve, aortic closure with Hemashield patch.  HISTORY AND PHYSICAL AND HOSPITAL COURSE:  The patient is a 76 year old woman who is recently being evaluated for bilateral knee replacements due to degenerative joint disease in the knees.  During her preoperative evaluation, she was noted to have a heart murmur.  She was seen by Dr. Jens Som and had an echocardiogram which revealed severe aortic stenosis with a mean gradient of 44 mmHg and a valve area estimated 0.6 sq. cm. The patient did have a normal left ventricular function.  She did undergo cardiac catheterization which showed normal coronary arteries. The valve area on cardiac catheterization was calculated at 0.58 sq. cm. Her mean gradient was 35 mmHg.  The patient was seen and evaluated by Dr. Dorris Fetch.  Dr. Dorris Fetch discussed with the patient undergoing aortic valve replacement.  He discussed the risks and benefits with the patient.  The patient also understood and agreed to proceed.  Surgery was scheduled for November 19, 2010.  Further details of  the patient's past medical history and physical exam, please see dictated H and P.  The patient was taken to the operating room on November 19, 2010, where she underwent aortic valve replacement with 23-mm [facility] Ease bovine pericardial valve with aortic closure with a Hemashield patch. The patient tolerated this procedure well and transferred to the intensive care unit in stable condition.  Postoperatively, the patient was noted to be hemodynamically stable.  She was extubated following surgery.  Post-extubation, the patient was noted to be alert and oriented x4.  Neuro intact.  Postoperatively, the patient was noted to be in normal sinus rhythm.  Blood pressure was stable.  She was started on low-dose beta-blocker.  Chest x-ray obtained was stable.  The patient had minimal drainage from chest tubes and chest tubes were discontinued in routine fashion.  The patient did have some mild volume overload and was started on diuretics postoperatively.  She also had some mild acute blood loss anemia and was monitored.  The patient's platelet count had dropped to 75 postoperatively and then HIT panel was ordered.  On the unit, the patient was up and ambulating with assistance.  She was tolerating diet well.  No nausea, vomiting noted.  The patient was transferred out to PCTU on postop day #2.  She has remained in normal sinus  rhythm with blood pressure well controlled.  Her platelet count was improving and increased by postop day #4 to 99.  Currently, HIT panel is still pending.  Hemoglobin and hematocrit remained stable.  She remained on daily diuretics for volume overload.  She remains 4.9 kg above her preoperative weight.  Followup chest x-ray showed stable small bilateral effusions.  The patient was encouraged to use her incentive spirometer.  She has been able to be weaned off oxygen with O2 saturations maintaining greater than 90% on room air.  On telemetry floor, the patient  continued to ambulate with assistance.  She is progressing well.  She is tolerating diet well.  No nausea, vomiting noted.  All incisions were clean, dry, and intact and healing well.  On postop day #4, November 23, 2010, the patient is afebrile, in normal sinus rhythm, blood pressure stable, O2 sats greater than 90% on room air.  LABORATORY WORK:  White blood cell count 7.4, hemoglobin 9.4, hematocrit 30.1, platelet count 99.  Sodium of 141, potassium 3.8, chloride 101, bicarbonate 31, BUN of 11, creatinine of 0.79, glucose of 84.  The patient is tentatively ready for discharge to home in the a.m. pending she remains stable.  FOLLOWUP APPOINTMENTS:  A followup appointment will be arranged with Dr. Dorris Fetch for in 3 weeks.  Our office will contact the patient with this information.  She will need to obtain PA and lateral chest x-ray 30 minutes prior to this appointment.  She will need to follow up with Dr. Jens Som in 2 weeks.  She need to contact his office to make these arrangements.  ACTIVITY:  The patient instructed no driving until released to do so, no lifting over 10 pounds.  She is told to ambulate 3-4 times per day, progress as tolerated and continue her breathing exercises.  INCISIONAL CARE:  The patient is told to shower, washing her incisions using soap and water.  She is to contact the office if she develops any drainage or opening from any of her incision sites.  DIET:  The patient educated on diet to be low fat, low salt.  DISCHARGE MEDICATIONS: 1. Enteric-coated aspirin 325 mg daily. 2. Lasix 40 mg daily x7 days. 3. Toprol-XL 25 mg daily. 4. Potassium chloride 40 mEq daily x7 days. 5. Ultram 50 mg 1-2 tablets q.4 p.r.n. pain. 6. Ibuprofen 200 mg 1-2 tablets q.8 h. p.r.n.     Sol Blazing, PA   ______________________________ Salvatore Decent Dorris Fetch, M.D.    KMD/MEDQ  D:  11/23/2010  T:  11/24/2010  Job:  [id number]  cc:   Salvatore Decent. Dorris Fetch,  M.D. Madolyn Frieze Jens Som, MD, [facility] Frankel. Charlann Boxer, M.D. Charlesetta Shanks  Electronically Signed by Cameron Proud PA on 11/24/2010 12:51:37 PM Electronically Signed by Charlett Lango M.D. on 11/27/2010 10:23:43 AM

## 2010-11-27 NOTE — Op Note (Signed)
NAMEAICIA, BABINSKI               ACCOUNT NO.:  [id number]  MEDICAL RECORD NO.:  [id number]           PATIENT TYPE:  I  LOCATION:  2316                         FACILITY:  [facility]  PHYSICIAN:  Salvatore Decent. Dorris Fetch, M.D.DATE OF BIRTH:  03/24/1935  DATE OF PROCEDURE:  11/19/2010 DATE OF DISCHARGE:                              OPERATIVE REPORT   PREOPERATIVE DIAGNOSIS:  Critical aortic stenosis.  POSTOPERATIVE DIAGNOSIS:  Critical aortic stenosis.  PROCEDURE:  Aortic valve replacement with 23-mm [facility] Ease bovine pericardial valve, model number [id number], serial number [id number], aortic closure with Hemashield patch.  SURGEON:  Salvatore Decent. Dorris Fetch, MD  ASSISTANT:  Doree Fudge, PA  ANESTHESIA:  General.  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  Postbypass transesophageal echocardiography revealed good function of the prosthetic valve with no perivalvular leaks, preserved left ventricular function.  CLINICAL NOTE:  [name] is a 76 year old woman who scheduled to undergo aortic valve replacement.  She was noted to have a significant heart murmur and workup revealed a valve area of 0.6 centimeter squared with the patient did not have any definite signs and symptoms of aortic stenosis.  Her activity is quite limited due to the knee replacements. Given the critical nature of the stenosis, the patient was advised to undergo aortic valve replacement prior to having her knee replacements. The indications, risks, benefits and alternatives as well as valve options were discussed in detail with the patient.  She understood and accepted risks of  surgery, understood the selection of the tissue valve and agreed to proceed.  OPERATIVE NOTE:  Ms. Raggio was brought to the preop holding area on November 19, 2010.  There, the Anesthesia Service placed a Swan-Ganz catheter and arterial blood pressure monitoring line and intravenous antibiotics were administered.  She was taken to the operating room, anesthetized and intubated.  A Foley catheter was placed.  The chest, abdomen and legs were prepped and draped in usual sterile fashion. Transesophageal echocardiography was performed, please refer to Dr. Randa Evens' separately dictated note for details.  The patient did have severe calcific aortic stenosis, mild mitral insufficiency, and mild tricuspid insufficiency.  There was also a trace aortic insufficiency. There was marked left ventricular hypertrophy with preserved left ventricular systolic function.  A median sternotomy was performed.  The patient was morbidly obese. There were some sternal osteoporosis.  Hemostasis was achieved.  The patient was fully heparinized.  Anticoagulation was monitored with ACT measurement.  The pericardium was opened.  The ascending aorta was inspected, it was essentially normal size with no palpable atherosclerotic disease or also there was no evidence of atherosclerotic disease on transesophageal echocardiography.  The aorta was cannulated via concentric 2-0 Ethibond pledgeted pursestring sutures after confirming adequate anticoagulation with ACT measurement.  A dual-stage venous cannula was placed via pursestring suture in the right atrial appendage.  The patient was placed on cardiopulmonary bypass  and flows were maintained per protocol.  A left ventricular vent was placed via pursestring suture in the right superior pulmonary vein and retrograde cardioplegic cannula was placed via pursestring suture in the right atrium and directed into the coronary sinus.  An antegrade cardioplegic cannula was placed  in the ascending aorta.  The patient was cooled to 32 degrees Celsius.  The aorta was crossclamped.  The left ventricle was emptied via the aortic root vent and left ventricular vent.  Cardiac arrest then was achieved combination of cold antegrade and retrograde blood cardioplegia and topical iced saline.  An initial 500 mL of cardioplegia was administered antegrade.  There was a rapid diastolic arrest and good septal cooling.  An additional 500 mL of cardioplegia was administered retrograde.  There was myocardial septal cooling to 8 degrees Celsius. Additional cardioplegia was administered at 20-minute intervals during the crossclamp portion of the procedure.  An aortotomy was made and the valve was inspected, it was tricuspid valve with heavy calcification of the leaflets particularly the noncoronary and right leaflets, which were essentially immobile.  The left leaflet was slightly mobile, but still heavily calcified.  The valve leaflets were excised.  There was moderate annular calcification. This was debrided taking care to remove all calcific debris.  The annulus after debridement was copiously irrigated with iced saline.  The valve sized for a 23-mm Magna Ease pericardial valve, this was selected and prepared per the manufacturer's recommendations.  The 2-0 Ethibond horizontal mattress sutures with subannular pledgets were placed circumferentially around the annulus, 15 sutures were utilized.  After the valve had been prepared, the sutures were passed through the sewing ring of the valve, which was then lowered into place, somewhat difficult to seat initially, but the valve did seat well and the sutures were sequentially tied.  After tying all the sutures, the annulus was gently probed with a fine-tipped right-angle to ensure there were no gaps.  Leaflets were gently spread and the valve was well seated.  Rewarming was begun.  On opening the aorta initially, it had been noted  that the right coronary ostia arose in almost a tunnel-like fashion from relatively high on the aorta leading to almost an intimal ridge.  The top of the tunnel was uncovered before having its true ostium.  To reconstruct the aorta directly would have caused least constriction if not complete occlusion of this tunnel to the right coronary artery.  Therefore, decision made to close the aorta using a Hemashield patch, so that smaller bites can be taken of the tissue with less tension on the tissue.  An 1 cm wide patch was selected.  This was then sewn in place with a running 4-0 Prolene suture during the more proximal portion of the atrial aorta first and then the more distal suture line was performed second.  Sutures were started at each end to meet in the middle.  After completing the second suture line, the patient was placed in Trendelenburg position.  A warm dose of retrograde cardioplegia was administered, 500 mL was given.  De-airing then was performed and the aortic crossclamp was removed.  The crossclamp time was 87 minutes.  The patient required a single defibrillation with 10 joules and then was in sinus rhythm thereafter.  As rewarming was completed, the aortotomy was inspected for hemostasis. The retrograde cardioplegia cannula was removed.  After the heart was beating well, additional de-airing was performed and the left ventricular vent was removed.  When the patient had warmed  to a core temperature of 37 degrees Celsius, epicardial pacing wires were placed on the right ventricle and right atrium.  The patient then was weaned from cardiopulmonary bypass on the first attempt.  The aortic cardioplegic cannula was left in place as a vent.  The patient did wean from bypass on the first attempt.  She was in sinus rhythm with no inotropic support at the time of separation from bypass.  The initial cardiac index was greater than 2 liters per minute per meter squared and the  patient remained hemodynamically stable throughout the postbypass period.  After ensuring that there was no additional area remaining in the heart, the aortic vent was removed and the pursestring suture tied. A test dose of protamine was administered and was well tolerated.  The atrial and aortic cannulae were removed.  The remainder of the protamine was administered without incident.  On postbypass transesophageal echocardiography, there was good function of the prosthetic valve with no perivalvular leaks.  Hemostasis was achieved.  The chest was copiously irrigated with warm saline.  The pericardium was reapproximated with interrupted 3-0 silk sutures.  There was no hemodynamic change with pericardial closure.  A 28-French Blake drain was placed through a separate subcostal incision and directed into the anterior mediastinum.  The chest was closed with interrupted heavy-gauge double stainless steel wires.  The pectoralis fascia, subcutaneous tissue and skin were closed in standard fashion.  All sponge, needle and instrument counts were correct at the end of the procedure.  The patient was taken from the operating room to the surgical intensive care unit in fair condition.     Salvatore Decent Dorris Fetch, M.D.     SCH/MEDQ  D:  11/19/2010  T:  11/20/2010  Job:  [id number]  cc:   Madolyn Frieze. Jens Som, MD, [facility]. Charlann Boxer, M.D.  Electronically Signed by Charlett Lango M.D. on 11/27/2010 10:23:41 AM

## 2010-11-28 ENCOUNTER — Telehealth: Payer: Self-pay | Admitting: Cardiology

## 2010-12-04 NOTE — Progress Notes (Signed)
Summary: Question about taking Aspirin  Phone Note Call from Patient Call back at Home Phone [phone]   Caller: Patient Summary of Call: Question about taking Aspirin Initial call taken by: Judie Grieve,  November 28, 2010 1:38 PM  Follow-up for Phone Call        pt stated she was told to take asa but didn't know if it was every day.  Instructed pt per office note pt is to take ASA every day.  she states understanding Follow-up by: Charolotte Capuchin, RN,  November 28, 2010 2:19 PM

## 2010-12-04 NOTE — Assessment & Plan Note (Signed)
Summary: f16m/post bcat/dm u nbale to confirm lmom=mj   CC:  follow up.  History of Present Illness: 76 year old female [name] for evaluation of severe AS. Echocardiogram in November of 2011 revealed normal LV function and severe aortic stenosis with a mean gradient of 44 mmHg. We scheduled an outpatient cardiac catheterization that was performed in January of 2012. This showed severe aortic stenosis and no obstructive coronary disease. On February 1 of 2012 the patient had aortic valve replacement with a  23-mm [facility] Ease bovine pericardial valve. Since discharge, the patient has dyspnea with more extreme activities but not with routine activities. It is relieved with rest. It is not associated with chest pain. There is no orthopnea, PND or pedal edema. There is no syncope or palpitations. There is no exertional chest pain.   Current Medications (verified): 1)  Furosemide 40 Mg Tabs (Furosemide) .... Take One Tablet By Mouth Daily. 2)  Metoprolol Succinate 25 Mg Xr24h-Tab (Metoprolol Succinate) .... Take One Tablet By Mouth Daily 3)  Potassium Chloride 40 Mg .Marland Kitchen.. 1 Tab By Mouth Once Daily 4)  Ultram 50 Mg Tabs (Tramadol Hcl) .... As Needed 5)  Ibuprofen 200 Mg Tabs (Ibuprofen) .... As Needed  Allergies: 1)  ! * Bee Stings  Past History:  Past Medical History: Reviewed history from 10/21/2010 and no changes required. VITAMIN D DEFICIENCY OSTEOARTHRITIS  MORBID OBESITY Fracture of left rib Aortic stenosis  Past Surgical History: Cataract Tonsillectomy Ovary removed secondary to cyst S/P AVR 2/12  Social History: Reviewed history from 09/17/2010 and no changes required. Retired  Tobacco Use - Former.  Alcohol Use - yes Regular Exercise - yes Widowed   Review of Systems       no fevers or chills, productive cough, hemoptysis, dysphasia, odynophagia, melena, hematochezia, dysuria, hematuria, rash, seizure activity, orthopnea, PND, pedal edema,  claudication. Remaining systems are negative.   Vital Signs:  Patient profile:   76 year old female Height:      62 inches Weight:      233 pounds BMI:     42.77 Pulse rate:   92 / minute Resp:     14 per minute BP sitting:   120 / 70  (left arm)  Vitals Entered By: Kem Parkinson (November 26, 2010 12:01 PM)  Physical Exam  General:  Well-developed obese in no acute distress.  Skin is warm and dry.  HEENT is normal.  Neck is supple. No thyromegaly.  Chest is clear to auscultation with normal expansion. Sternotomy without evidence of infection. Mild tenderness. Cardiovascular exam is regular rate and rhythm. 1/6 systolic murmur left sternal border. No diastolic murmur. Abdominal exam nontender or distended. No masses palpated. Extremities show no edema. neuro grossly intact    Impression & Recommendations:  Problem # 1:  AORTIC VALVE REPLACEMENT, HX OF (ICD-V43.3) Patient is doing well status post aortic valve replacement. Patient instructed on SBE prophylaxis. Resume aspirin. Schedule baseline echocardiogram status post aortic valve replacement.  Problem # 2:  ARTHRITIS (ICD-716.90) Pt with significant osteoarthritis of her knees. Will delay knee replacement for 6 months status post aVR.  Other Orders: Echocardiogram (Echo)  Patient Instructions: 1)  Your physician wants you to follow-up in6 months.:   You will receive a reminder letter in the mail two months in advance. If you don't receive a letter, please call our office to schedule the follow-up appointment. 2)  Your physician has requested that you have an echocardiogram in 6  weeks..  Echocardiography is a painless test that uses sound waves to create images of your heart. It provides your doctor with information about the size and shape of your heart and how well your heart's chambers and valves are working.  This procedure takes approximately one hour. There are no restrictions for this procedure. 3)  To resume  ASA per physician.

## 2010-12-04 NOTE — Consult Note (Signed)
Summary: [facility]   [facility]   Imported By: Roderic Ovens 11/27/2010 09:35:58  _____________________________________________________________________  External Attachment:    Type:   Image     Comment:   External Document

## 2010-12-16 ENCOUNTER — Other Ambulatory Visit: Payer: Self-pay | Admitting: Thoracic Surgery (Cardiothoracic Vascular Surgery)

## 2010-12-16 DIAGNOSIS — I359 Nonrheumatic aortic valve disorder, unspecified: Secondary | ICD-10-CM

## 2010-12-17 ENCOUNTER — Encounter (INDEPENDENT_AMBULATORY_CARE_PROVIDER_SITE_OTHER): Payer: Self-pay | Admitting: Thoracic Surgery (Cardiothoracic Vascular Surgery)

## 2010-12-17 ENCOUNTER — Ambulatory Visit
Admission: RE | Admit: 2010-12-17 | Discharge: 2010-12-17 | Disposition: A | Discharge status: Home / Self Care | Payer: Medicare HMO | Source: Ambulatory Visit | Attending: Thoracic Surgery (Cardiothoracic Vascular Surgery) | Admitting: Thoracic Surgery (Cardiothoracic Vascular Surgery)

## 2010-12-17 DIAGNOSIS — I359 Nonrheumatic aortic valve disorder, unspecified: Secondary | ICD-10-CM

## 2010-12-18 NOTE — Assessment & Plan Note (Signed)
OFFICE VISIT  [name], [name] DOB:  [birth year]                                        December 17, 2010 CHART #:  [id number]  The patient is a 76 year old woman who was earlier of this year being evaluated for bilateral knee replacement due to severe arthritis in both knees.  She was noted to have a heart murmur.  She was evaluated by Dr. Olga Millers and found to have critical aortic stenosis with normal left ventricular function.  She underwent aortic valve replacement with a 23-mm Magna-Ease bovine pericardial valve on November 19, 2010.  We did have to use a piece of Hemashield patch to close her aorta but really she did quite well postoperatively.  She did have some thrombocytopenia postoperatively and HIT panel was performed and was negative.  Since discharge, she says has been doing well.  She took about 2 or 3 pain pills when she first go home.  She has not had to take any in at least 2 weeks.  She has been doing some walking, it is primarily limited by knee pain, but overall she feels well.  Her current medications are aspirin 325 mg daily, Toprol-XL 25 mg daily. She uses ibuprofen p.r.n.  She has no known drug allergies.  On physical examination, the patient is a morbidly obese 76 year old woman, in no acute distress.  Her sternal incision is clean, dry, and intact.  Sternum is stable.  She has no peripheral edema.  Lungs have slightly diminished breath sounds at the left base, but otherwise clear.  Chest x-ray shows a small left pleural effusion.  IMPRESSION:  The patient is now a month out from aortic valve replacement with a pericardial valve.  She was treated with aspirin, did not require Coumadin.  She is really doing very well at this point in time.  I advised her not to lift any weight greater than 10 pounds for at least another 2 weeks, but otherwise her activities are unrestricted. I do think it would be best for her to wait  probably about another month before having her knees replaced but after that time, she can do it at anytime that suited her.  She will continued to be followed by Dr. Olga Millers from a cardiac perspective, but I would be happy to see her back anytime if I can be of any further assistance with her care.  She does have a small left pleural effusion, it is not large enough to warrant treatment and only really needs followup if she were to become symptomatic.  Salvatore Decent Dorris Fetch, M.D. Electronically Signed  SCH/MEDQ  D:  12/17/2010  T:  12/17/2010  Job:  [id number]  cc:   Madolyn Frieze. Jens Som, MD, [facility]. Charlann Boxer, M.D.

## 2010-12-23 ENCOUNTER — Telehealth: Payer: Self-pay | Admitting: Cardiology

## 2010-12-30 NOTE — Miscellaneous (Signed)
Summary: [facility] Physician Order/Treatment Plan   [facility] Physician Order/Treatment Plan   Imported By: Roderic Ovens 12/26/2010 10:33:10  _____________________________________________________________________  External Attachment:    Type:   Image     Comment:   External Document

## 2010-12-30 NOTE — Progress Notes (Signed)
Summary: letter to transportation- LVMTCB*  Phone Note Call from Patient Call back at Home Phone [phone]   Caller: Patient Reason for Call: Talk to Nurse, Talk to Doctor Summary of Call: per pt call transportation didn't get letter we faxed so she was calling to f/u Initial call taken by: Omer Jack,  December 23, 2010 1:46 PM  Follow-up for Phone Call        LVMTCB* Whitney Maeola Sarah RN  December 23, 2010 2:17 PM  Needed a letter faxed to [facility]. Transportation that patient was in our office last week. Will fax to [employer]. Whitney Maeola Sarah RN  December 23, 2010 2:37 PM  Follow-up by: Whitney Maeola Sarah RN,  December 23, 2010 2:17 PM

## 2011-01-04 LAB — CBC
HCT: 44.5 % (ref 36.0–46.0)
Hemoglobin: 14.8 g/dL (ref 12.0–15.0)
Platelets: 174 10*3/uL (ref 150–400)
WBC: 7.3 10*3/uL (ref 4.0–10.5)

## 2011-01-04 LAB — URINALYSIS, ROUTINE W REFLEX MICROSCOPIC
Bilirubin Urine: NEGATIVE
Glucose, UA: NEGATIVE mg/dL
Ketones, ur: NEGATIVE mg/dL
pH: 5.5 (ref 5.0–8.0)

## 2011-01-04 LAB — DIFFERENTIAL
Basophils Relative: 1 % (ref 0–1)
Eosinophils Absolute: 0.3 10*3/uL (ref 0.0–0.7)
Eosinophils Relative: 4 % (ref 0–5)
Monocytes Absolute: 0.7 10*3/uL (ref 0.1–1.0)
Monocytes Relative: 9 % (ref 3–12)

## 2011-01-06 ENCOUNTER — Other Ambulatory Visit (HOSPITAL_COMMUNITY): Payer: Self-pay | Admitting: Radiology

## 2011-01-06 DIAGNOSIS — I359 Nonrheumatic aortic valve disorder, unspecified: Secondary | ICD-10-CM

## 2011-01-06 NOTE — Progress Notes (Unsigned)
This encounter was created in error - please disregard.

## 2011-01-07 ENCOUNTER — Other Ambulatory Visit (HOSPITAL_COMMUNITY): Payer: Medicare HMO | Admitting: Radiology

## 2011-01-14 ENCOUNTER — Encounter (HOSPITAL_COMMUNITY): Payer: Medicare HMO | Admitting: Dentistry

## 2011-01-14 DIAGNOSIS — K08109 Complete loss of teeth, unspecified cause, unspecified class: Secondary | ICD-10-CM

## 2011-01-26 ENCOUNTER — Encounter (HOSPITAL_COMMUNITY): Payer: Self-pay | Admitting: Dentistry

## 2011-01-26 DIAGNOSIS — K08109 Complete loss of teeth, unspecified cause, unspecified class: Secondary | ICD-10-CM

## 2011-01-26 DIAGNOSIS — K0820 Unspecified atrophy of edentulous alveolar ridge: Secondary | ICD-10-CM

## 2011-01-31 ENCOUNTER — Emergency Department (HOSPITAL_COMMUNITY)
Admission: EM | Admit: 2011-01-31 | Discharge: 2011-01-31 | Disposition: A | Discharge status: Home / Self Care | Payer: Medicare HMO | Attending: Emergency Medicine | Admitting: Emergency Medicine

## 2011-01-31 ENCOUNTER — Emergency Department (HOSPITAL_COMMUNITY): Payer: Medicare HMO

## 2011-01-31 DIAGNOSIS — IMO0002 Reserved for concepts with insufficient information to code with codable children: Secondary | ICD-10-CM | POA: Insufficient documentation

## 2011-01-31 DIAGNOSIS — S0990XA Unspecified injury of head, initial encounter: Secondary | ICD-10-CM | POA: Insufficient documentation

## 2011-01-31 DIAGNOSIS — Z954 Presence of other heart-valve replacement: Secondary | ICD-10-CM | POA: Insufficient documentation

## 2011-01-31 DIAGNOSIS — S0120XA Unspecified open wound of nose, initial encounter: Secondary | ICD-10-CM | POA: Insufficient documentation

## 2011-01-31 DIAGNOSIS — W010XXA Fall on same level from slipping, tripping and stumbling without subsequent striking against object, initial encounter: Secondary | ICD-10-CM | POA: Insufficient documentation

## 2011-01-31 DIAGNOSIS — S022XXA Fracture of nasal bones, initial encounter for closed fracture: Secondary | ICD-10-CM | POA: Insufficient documentation

## 2011-01-31 DIAGNOSIS — S022XXB Fracture of nasal bones, initial encounter for open fracture: Secondary | ICD-10-CM | POA: Insufficient documentation

## 2011-01-31 DIAGNOSIS — I6789 Other cerebrovascular disease: Secondary | ICD-10-CM | POA: Insufficient documentation

## 2011-02-09 ENCOUNTER — Encounter (HOSPITAL_COMMUNITY): Payer: Medicare HMO | Admitting: Dentistry

## 2011-02-09 DIAGNOSIS — M264 Malocclusion, unspecified: Secondary | ICD-10-CM

## 2011-02-09 DIAGNOSIS — K08109 Complete loss of teeth, unspecified cause, unspecified class: Secondary | ICD-10-CM

## 2011-02-17 ENCOUNTER — Encounter (HOSPITAL_COMMUNITY): Payer: Medicare HMO | Admitting: Dentistry

## 2011-02-17 DIAGNOSIS — K08109 Complete loss of teeth, unspecified cause, unspecified class: Secondary | ICD-10-CM

## 2011-02-17 DIAGNOSIS — K08409 Partial loss of teeth, unspecified cause, unspecified class: Secondary | ICD-10-CM

## 2011-02-17 DIAGNOSIS — M264 Malocclusion, unspecified: Secondary | ICD-10-CM

## 2011-02-25 DIAGNOSIS — K08109 Complete loss of teeth, unspecified cause, unspecified class: Secondary | ICD-10-CM

## 2011-02-25 DIAGNOSIS — K08409 Partial loss of teeth, unspecified cause, unspecified class: Secondary | ICD-10-CM

## 2011-02-25 DIAGNOSIS — M264 Malocclusion, unspecified: Secondary | ICD-10-CM

## 2011-03-03 NOTE — Consult Note (Signed)
NEW PATIENT CONSULTATION   [name], [name]  DOB:  07-Jul-1935                                        October 28, 2010  CHART #:  [id number]   REASON FOR CONSULTATION:  Severe aortic stenosis.   HISTORY OF PRESENT ILLNESS:  The patient is a 76 year old woman who was  recently being evaluated for bilateral knee replacements due to  degenerative joint disease in the knees.  She during her preoperative  evaluation was noted to have a heart murmur.  She has only recently  moved to [place] from [place].  She was sent to Dr. Olga Millers  and had an echocardiogram which revealed severe aortic stenosis with a  mean gradient of 44 mmHg and a valve area estimated at 0.6 sq. cm.  She  did have normal left ventricular function.  Given the severity of her  aortic stenosis, she was advised to have cardiac catheterization  followed by valve replacement.  At cardiac catheterization, her mean  gradient was 35 mmHg. The valve area was calculated at 0.58 sq. cm.  Her  right-sided pressures were normal, cardiac index was 2.  She had normal  coronary arteries.   The patient denies any chest pain, shortness of breath, paroxysmal  nocturnal dyspnea, orthopnea, or peripheral edema.  She does not have  any tightness or pressure in her chest; however, her activities are very  limited due to her bilateral knee pain and can only walk about 100 feet  before having to stop due to her knees.   PAST MEDICAL HISTORY:  Significant for aortic stenosis, osteoarthritis,  morbid obesity, left rib fracture, vitamin D deficiency.   MEDICATIONS:  She currently is on no medications.   ALLERGIES:  She is allergic to bee stings.  No drug allergies.   PAST SURGICAL HISTORY:  Significant for cataract removal, ovary removal  secondary to cyst, and tonsillectomy.   FAMILY HISTORY:  No known history of premature heart disease.   SOCIAL HISTORY:  She is a former smoker, smoked about half-a-pack a  day  for 25 years, quit several years ago.  She occasionally drinks alcohol.  She does exercise regularly but is very limited due to her knee pain.  She is retired, recently moved to [place] from [place] with her  daughter.   REVIEW OF SYSTEMS:  Knee arthralgias as noted.  The patient denies any  other symptoms.  No history of excessive bleeding or bruising.  No  stroke or TIA symptoms.  No chest pain, shortness of breath, orthopnea,  paroxysmal nocturnal dyspnea, or peripheral edema.  Denies claudication.  All other systems negative.   PHYSICAL EXAMINATION:  General:  The patient is a 76 year old woman in  no acute distress.  She is obese but otherwise well developed and well  nourished.  Vital Signs:  Her blood pressure is 132/81, pulse 88,  respirations are 18, oxygen saturation is 95% on room air.  She is 5  feet tall, 220 pounds.  Neurologic:  She is alert and oriented x3 with  no focal deficits.  HEENT:  Remarkable for poor dentition.  Neck:  Supple with transmitted murmur sounds bilaterally.  Cardiac:  Difficult  to auscultate due to the patient's body habitus but does have a 2/6  systolic murmur heard best at the right upper sternal border.  Abdomen:  Soft,  nontender.  Extremities:  Pulses 2+ throughout.  There is no  peripheral edema.   LABORATORY DATA:  Cardiac catheterization and echocardiogram reviewed,  both revealed severe aortic stenosis.   IMPRESSION:  The patient is a 76 year old woman with severe aortic  stenosis found during workup and evaluation for bilateral knee  replacement.  Her valve area is about 0.6 sq. cm and this is a critical  valve and does indicate the need for aortic valve replacement and this  should be done prior to her knee surgery.  I had a long discussion with  the patient and her daughter regarding the function of the aortic valve,  the natural history of aortic stenosis, and indications for surgery.  They understand that at the present time  indication is for survival  benefit as she is not currently having any symptoms.  I discussed in  detail with them the operative approach, the need for the use of the  heart-lung machine and general anesthesia, expected hospital stay, and  overall recovery, as well as expected time of at least 8 weeks having  her knee replacements done.  We did discuss valve options including  tissue and mechanical valves.  Given her age and need for additional  surgical procedures, tissue valves was by far the preferred valve in her  case, and we would not plan to use Coumadin unless she were to have  atrial fibrillation or some other indication at the time of surgery.   I discussed in detail with her the risks and benefits of the surgery.  She understands that the risks include but are not limited to death,  stroke, MI, DVT, PE, bleeding, possible need for transfusions,  infections, heart block requiring permanent pacemaker placement as well  as other organ system dysfunction including respiratory, renal, and GI  complications.  She understands and accepts these risks and agrees to  proceed.   She does have a rather poor dentition.  She takes great care of her  teeth but has not seen a dentist in several years and I recommended that  she see a dentist and we will try and get her an appointment with Dr.  Kristin Bruins to be evaluated for any necessary dental work prior to her  aortic valve replacement.  Once that has been accomplished, we will get  her on the schedule as soon as possible, hopefully within a week or two.   Salvatore Decent Dorris Fetch, M.D.  Electronically Signed   SCH/MEDQ  D:  10/28/2010  T:  10/29/2010  Job:  [id number]   cc:   Madolyn Frieze. Jens Som, MD, [facility]  Hedwig Morton. Charlann Boxer, M.D.

## 2011-03-05 DIAGNOSIS — K08109 Complete loss of teeth, unspecified cause, unspecified class: Secondary | ICD-10-CM

## 2011-03-05 DIAGNOSIS — M264 Malocclusion, unspecified: Secondary | ICD-10-CM

## 2011-03-16 DIAGNOSIS — K08109 Complete loss of teeth, unspecified cause, unspecified class: Secondary | ICD-10-CM

## 2011-03-16 DIAGNOSIS — K1370 Unspecified lesions of oral mucosa: Secondary | ICD-10-CM

## 2011-04-27 ENCOUNTER — Encounter: Payer: Self-pay | Admitting: Cardiology

## 2011-06-01 ENCOUNTER — Other Ambulatory Visit: Payer: Self-pay | Admitting: Orthopedic Surgery

## 2011-06-01 ENCOUNTER — Encounter (HOSPITAL_COMMUNITY): Payer: Medicare HMO

## 2011-06-01 LAB — DIFFERENTIAL
Eosinophils Relative: 6 % — ABNORMAL HIGH (ref 0–5)
Lymphocytes Relative: 19 % (ref 12–46)
Lymphs Abs: 1 10*3/uL (ref 0.7–4.0)
Monocytes Absolute: 0.5 10*3/uL (ref 0.1–1.0)

## 2011-06-01 LAB — CBC
HCT: 44.3 % (ref 36.0–46.0)
MCV: 93.3 fL (ref 78.0–100.0)
RDW: 14.7 % (ref 11.5–15.5)
WBC: 5 10*3/uL (ref 4.0–10.5)

## 2011-06-01 LAB — URINALYSIS, ROUTINE W REFLEX MICROSCOPIC
Glucose, UA: NEGATIVE mg/dL
Hgb urine dipstick: NEGATIVE
Specific Gravity, Urine: 1.027 (ref 1.005–1.030)
Urobilinogen, UA: 1 mg/dL (ref 0.0–1.0)
pH: 5.5 (ref 5.0–8.0)

## 2011-06-01 LAB — BASIC METABOLIC PANEL
BUN: 15 mg/dL (ref 6–23)
Calcium: 8.7 mg/dL (ref 8.4–10.5)
Creatinine, Ser: 0.63 mg/dL (ref 0.50–1.10)
GFR calc Af Amer: >60 mL/min (ref >60)
GFR calc non Af Amer: >60 mL/min (ref >60)

## 2011-06-01 LAB — PROTIME-INR
INR: 1.02 (ref 0.00–1.49)
Prothrombin Time: 13.6 seconds (ref 11.6–15.2)

## 2011-06-01 LAB — URINE MICROSCOPIC-ADD ON

## 2011-06-04 ENCOUNTER — Ambulatory Visit: Payer: Self-pay | Admitting: Cardiology

## 2011-06-05 NOTE — H&P (Addendum)
NAMETAMBER, BURTCH               ACCOUNT NO.:  [id number]  MEDICAL RECORD NO.:  [id number]  LOCATION:  PADM                         FACILITY:  [facility]  PHYSICIAN:  Madlyn Frankel. Charlann Boxer, M.D.  DATE OF BIRTH:  1935/02/03  DATE OF ADMISSION:  06/01/2011 DATE OF DISCHARGE:                             HISTORY & PHYSICAL   DATE OF SURGERY:  06/08/2011  DIAGNOSIS:  Left knee osteoarthritis.  HISTORY OF PRESENT ILLNESS:  The patient is a 76 year old female complaining of left knee pain for approximately 5-6 years.  The patient denies any trauma or initial incident.  The patient has been seen Dr. Charlann Boxer for some time.  X-rays were taken revealing osteoarthritis of the left knee as well as the right, left being worse than right.  The patient has had an steroid injections which has helped with subsequent injections are less than less effective.  Various options were discussed.  The patient wished to proceed with surgery.  Risks, benefits, and expectations of the procedure were discussed with the patient.  The patient understands risks, benefits, and expectations and wishes to proceed with a left knee total knee replacement.  The patient is planning going to the [facility] after surgery.  The patient was not given postop medications at the time of the H and P. The patient is not a candidate for tranexamic acid.  The patient's primary care physician is Windy Carina.  The patient also sees Madolyn Frieze. Jens Som, M.D.  PAST MEDICAL HISTORY: 1. Heart disease. 2. Sleep apnea. 3. Arthritis.  PAST SURGICAL HISTORY:  The patient denies any past surgery.  MEDICATIONS:  The patient denies any current medications.  ALLERGIES:  No known drug allergies.  SOCIAL HISTORY:  The patient is widowed.  She denies using any tobacco at this time.  The patient does admit to the occasional use of alcohol.  REVIEW OF SYSTEMS:  The patient does complain of constipation and some shortness of breath  with exertion, otherwise unremarkable.  PHYSICAL EXAMINATION:  GENERAL:  The patient is a 76 year old white female in no acute distress. VITAL SIGNS:  Stable.  Blood pressure is 150/83, respirations 16, and pulse was 94. HEENT:  Pupils are equal, round, and reactive to light and accommodation.  Throat is clear. NECK:  Supple.  No JVD.  No carotid bruits.  No lymphadenopathy. CARDIAC:  Normal appearing S1 and S2.  No murmur appreciated. RESPIRATORY:  Lungs are clear to auscultation bilaterally. NEURO:  The patient is oriented x3. ORTHO:  Pertaining to the left knee, the patient does have a slight tenderness to palpation of her anterior tibial surface both medially and laterally.  There is no pain over the medial or lateral joint lines. There is no pain over the quadriceps tendon, patellar tendon, or patella.  The patient does have slight retropatellar crepitus with range of motion.  The patient is lacking couple degrees to full extension and the patient has full flexion.  The patient has posterior dorsalis pedis pulse.  The patient has a good sensation distally.  The patient's surgical site looks clear.  IMPRESSION:  Left knee osteoarthritis.  STUDIES:  X-rays above.  PLAN:  The patient was admitted  to the hospital to undergo a left total knee arthroplasty per Dr. Charlann Boxer on 06/08/19/12.  Risks, benefits, and expectations of the procedure were discussed with the patient.  The patient understand the risks, benefits, and expectations and wishes to proceed with surgery.    ______________________________ Lanney Gins, PA   ______________________________ Madlyn Frankel. Charlann Boxer, M.D.    MB/MEDQ  D:  06/03/2011  T:  06/04/2011  Job:  [id number]  Electronically Signed by Lanney Gins PA on 06/11/2011 12:59:30 PM Electronically Signed by Durene Romans M.D. on 06/12/2011 07:58:08 AM

## 2011-06-08 ENCOUNTER — Inpatient Hospital Stay (HOSPITAL_COMMUNITY)
Admission: RE | Admit: 2011-06-08 | Discharge: 2011-06-11 | DRG: 470 | Disposition: A | Discharge status: Skilled Nursing Facility | Payer: Medicare HMO | Source: Ambulatory Visit | Attending: Orthopedic Surgery | Admitting: Orthopedic Surgery

## 2011-06-08 DIAGNOSIS — G4733 Obstructive sleep apnea (adult) (pediatric): Secondary | ICD-10-CM | POA: Diagnosis present

## 2011-06-08 DIAGNOSIS — Z01812 Encounter for preprocedural laboratory examination: Secondary | ICD-10-CM

## 2011-06-08 DIAGNOSIS — Z954 Presence of other heart-valve replacement: Secondary | ICD-10-CM

## 2011-06-08 DIAGNOSIS — I519 Heart disease, unspecified: Secondary | ICD-10-CM | POA: Diagnosis present

## 2011-06-08 DIAGNOSIS — M1710 Unilateral primary osteoarthritis, unspecified knee: Principal | ICD-10-CM | POA: Diagnosis present

## 2011-06-08 LAB — ABO/RH: ABO/RH(D): O POS

## 2011-06-09 LAB — BASIC METABOLIC PANEL
BUN: 16 mg/dL (ref 6–23)
CO2: 29 mEq/L (ref 19–32)
Chloride: 102 mEq/L (ref 96–112)
Creatinine, Ser: 0.67 mg/dL (ref 0.50–1.10)
Potassium: 4.1 mEq/L (ref 3.5–5.1)

## 2011-06-09 LAB — CBC
HCT: 34.6 % — ABNORMAL LOW (ref 36.0–46.0)
Hemoglobin: 11.1 g/dL — ABNORMAL LOW (ref 12.0–15.0)
MCH: 30.3 pg (ref 26.0–34.0)
MCV: 94.5 fL (ref 78.0–100.0)
Platelets: 132 10*3/uL — ABNORMAL LOW (ref 150–400)
RBC: 3.66 MIL/uL — ABNORMAL LOW (ref 3.87–5.11)
WBC: 7.9 10*3/uL (ref 4.0–10.5)

## 2011-06-10 LAB — BASIC METABOLIC PANEL
BUN: 14 mg/dL (ref 6–23)
Chloride: 106 mEq/L (ref 96–112)
Creatinine, Ser: 0.7 mg/dL (ref 0.50–1.10)
GFR calc Af Amer: >60 mL/min (ref >60)
Glucose, Bld: 123 mg/dL — ABNORMAL HIGH (ref 70–99)
Potassium: 4.1 mEq/L (ref 3.5–5.1)

## 2011-06-10 LAB — CBC
HCT: 31 % — ABNORMAL LOW (ref 36.0–46.0)
Hemoglobin: 10.2 g/dL — ABNORMAL LOW (ref 12.0–15.0)
MCHC: 32.9 g/dL (ref 30.0–36.0)
MCV: 94.8 fL (ref 78.0–100.0)
RDW: 14.8 % (ref 11.5–15.5)
WBC: 7.4 10*3/uL (ref 4.0–10.5)

## 2011-06-12 NOTE — Op Note (Signed)
[name], [name]               ACCOUNT NO.:  [id number]  MEDICAL RECORD NO.:  [id number]  LOCATION:  1621                         FACILITY:  [facility]  PHYSICIAN:  Madlyn Frankel. Charlann Boxer, M.D.  DATE OF BIRTH:  [birth year]  DATE OF PROCEDURE:  06/08/2011 DATE OF DISCHARGE:                              OPERATIVE REPORT   PREOPERATIVE DIAGNOSIS:  Left knee osteoarthritis.  PROCEDURE:  Left total knee replacement utilizing components, DePuy rotating platform posterior stabilized knee system with size 4 __________ femur, size 3 tibia, 10-mm insert, and a 38 patellar button.  SURGEON:  Madlyn Frankel. Charlann Boxer, M.D.  ASSISTANT:  Lanney Gins, PA-C  ANESTHESIA:  Attempted spinal with conversion to a general anesthetic.  SPECIMEN:  None.  COMPLICATIONS:  None.  DRAINS:  One Hemovac drain.  TOURNIQUET TIME:  32 minutes at 250 mmHg.  INDICATIONS FOR PROCEDURE:  Ms. Shiller is a 76 year old female who presented to office for evaluation of bilateral knee pain, left knee was worse than the other.  She had failed conservative measures.  She had radiographic change of bone-on-bone arthritis.  She had failed injections and medications.  At this point, there is significant reduction of quality of life.  She wished to proceed with knee replacement.  Risks of infection, DVT, postoperative stiffness, need for any revision were discussed and reviewed.  Consent was obtained for benefit of pain relief.  Postoperative course and expectations were reviewed.  PROCEDURE IN DETAIL:  The patient was brought to operative theater. Once adequate anesthesia, preoperative antibiotics, Ancef was administered, the patient was positioned supine with a left thigh tourniquet placed.  Left lower extremity was then prepped and draped in sterile fashion.  A time-out was performed to identify the patient, plan of procedure, and extremity.  Left leg was placed in the Mayo leg holder.  Leg was exsanguinated, tourniquet  elevated to 250 mmHg.  Midline incision was made followed by median parapatellar arthrotomy.  Following initial exposure, attention was first directed to the patella, precut measurement is 24 mm.  I resected down to about 14 mm using a 38 patellar button to restore height.  Lug holes were drilled and a metal shim placed to protect the patella from retractor and saw blades.  Attention was now directed to the femur.  Femoral canal was opened with a drill, irrigated to try to prevent fat emboli.  The intramedullary rod was passed in a 3-degree to valgus, 10 mm was then resected off the distal femur.  Following this resection, the tibia was subluxated anteriorly and using an extramedullary device, a measured resection of 10 mm off the proximal lateral tibia was carried down.  At this point, we confirmed the gap would be stable medially and laterally 10 mm insert.  I then confirmed that the cut was perpendicular in the coronal plane.  At this point, I sized the femur to be a size 4, anterior-posterior dimension size 4 rotation block was then pinned into position, anterior reference using a C-clamp set rotation off the proximal tibia cut.  The four-in-one cutting block was then pinned in position.  Anterior, posterior, and chamfer cuts were made without difficulty nor notching.  Final box was mapped  to the lateral aspect of distal femur.  Following this resection, attention was redirected to tibia.  The tibia cut was best fit with a size 3 tibial tray, it was pinned into position through the medial third of the tubercle, drilled and keel punched, trial reduction is now carried out with a 4 femur, 3 tibia and a 10-mm insert. The knee came to full extension with stable medial, lateral, and collateral ligaments.  The patella tracked through the trochlea without application of pressure.  Given these findings, the final components were opened, cement was mixed.  The knee was irrigated with  normal saline solution and pulse lavaged.  The synovial capsule junction was injected with 0.25% Marcaine with epinephrine and 1 cc of Toradol __________ .  Final components were then cemented on a clean and dried cut surface of the bone.  The knee was brought to extension with a 10 mm insert, and extruded cement was removed.  Once the cement fully cured and excessive cement was removed throughout the knee, the final 10 mm insert was tethered in place.  The tourniquet have been let down at 32 minutes without significant hemostasis.  The knee was re-irrigated with normal saline solution.  Medium Hemovac drain was placed, deep extensor mechanism was then reapproximated using #1 Vicryl with the knee in flexion.  The remainder of the wound was closed with 2-0 Vicryl, running 4-0 Monocryl.  The knee was cleaned, dry, and dressed sterilely using Dermabond and Aquacel dressing.  Drain site was dressed separately.  The patient's knee was then wrapped loosely in Ace wrap.  She was then brought to recovery room in stable condition, tolerating the procedure well.     Madlyn Frankel Charlann Boxer, M.D.     MDO/MEDQ  D:  06/08/2011  T:  06/08/2011  Job:  [id number]  Electronically Signed by Durene Romans M.D. on 06/12/2011 07:58:17 AM

## 2011-06-15 NOTE — Discharge Summary (Signed)
NAMEBRETT, [name]               ACCOUNT NO.:  [id number]  MEDICAL RECORD NO.:  [id number]  LOCATION:  1621                         FACILITY:  [facility]  PHYSICIAN:  Madlyn Frankel. Charlann Boxer, M.D.  DATE OF BIRTH:  Aug 05, 1935  DATE OF ADMISSION:  06/08/2011 DATE OF DISCHARGE:  06/01/2011                        DISCHARGE SUMMARY - REFERRING   PROCEDURES:  Left total knee arthroplasty.  ADMITTING DIAGNOSIS:  Left knee osteoarthritis.  DISCHARGE DIAGNOSES: 1. Status post left total knee arthroplasty. 2. Heart disease. 3. Sleep apnea. 4. Arthritis.  HISTORY OF PRESENT ILLNESS:  The patient is a 76 year old female complaining of left knee pain for approximately 5 to 6 years.  The patient denies any trauma or initiating event.  The patient has been seen by Dr. Charlann Boxer for some time.  X-rays were taken revealing osteoarthritis of left knee as well as the right, left being worse than right.  The patient has had steroid injections which has helped, but subsequent ejections which had been less and less effective.  Various options were discussed.  The patient wished to proceed with surgery. Risks, benefits, and expectations of the procedure were discussed with the patient.  The patient understands the risks, benefits, and expectations; and wished to proceed with surgery.  HOSPITAL COURSE:  The patient underwent the above-stated procedure on June 08, 2011.  The patient tolerated the procedure well, and was brought to the recovery room in good condition and subsequently to the floor.  On postoperative day #1, June 09, 2011, the patient was doing well and no events.  The patient's pain is well-controlled, afebrile, and vital signs stable.  She is distally neurovascular intact.  H and H is 11.1/34.6.  Dressing was good, clean, dry, and intact.  Hemovac drain was removed.  The patient did not walk well with physical therapy and she was encouraged to work harder at this to recover faster in  the subsequent days.  On postoperative day #2, June 10, 2011, the patient was doing okay, but progressed in bed.  She is afebrile and vital signs stable. Hematocrit was 31.0.  Dressings were clean, dry, and intact; and continued with physical therapy.  On postoperative day #3, June 11, 2011, the patient was doing fine. Afebrile and vital signs stable.  Left knee was clean, dry, and intact. The patient had physical therapy.  It was felt that the patient was doing well enough and to be discharged to skilled care facility [facility]).  DISCHARGE CONDITION:  Good.  DISCHARGE INSTRUCTIONS:  The patient will be discharged to the [facility] today after physical therapy.  The patient will be weightbearing as tolerated.  The patient should maintain her surgical dressing for about 7 to 8 days, after which she will replace it with gauze and tape.  The patient is to keep the area dry and clean until follow-up.  The patient will follow up with Dr. Charlann Boxer at [facility] in 2 weeks.  The patient is to call with any concerns or questions.  DISCHARGE MEDICATIONS: 1. Aspirin enteric-coated 325 mg one p.o. b.i.d. x4 weeks. 2. Benadryl 25 mg one p.o. q.4 hours p.r.n. 3. Colace 100 mg one p.o. b.i.d. constipation. 4.  Iron sulfate 325 mg one p.o. t.i.d. x2-3 weeks. 5. Norco 7.5/25 one to two p.o. q.4 hours to 6 hours p.r.n. pain. 6. Robaxin 500 mg one p.o. q.6 hours p.r.n. muscle spasms. 7. MiraLax 17 g one p.o. daily and p.r.n. constipation.    ______________________________ Lanney Gins, PA   ______________________________ Madlyn Frankel. Charlann Boxer, M.D.    MB/MEDQ  D:  06/11/2011  T:  06/11/2011  Job:  [id number]  Electronically Signed by Lanney Gins PA on 06/12/2011 91:47:82 AM Electronically Signed by Durene Romans M.D. on 06/15/2011 07:43:32 AM

## 2011-06-24 ENCOUNTER — Emergency Department (HOSPITAL_COMMUNITY): Payer: Medicare HMO

## 2011-06-24 ENCOUNTER — Emergency Department (HOSPITAL_COMMUNITY)
Admission: EM | Admit: 2011-06-24 | Discharge: 2011-06-24 | Disposition: A | Discharge status: Home / Self Care | Payer: Medicare HMO | Attending: Emergency Medicine | Admitting: Emergency Medicine

## 2011-06-24 DIAGNOSIS — R0602 Shortness of breath: Secondary | ICD-10-CM | POA: Insufficient documentation

## 2011-06-24 DIAGNOSIS — M1990 Unspecified osteoarthritis, unspecified site: Secondary | ICD-10-CM | POA: Insufficient documentation

## 2011-06-24 DIAGNOSIS — R079 Chest pain, unspecified: Secondary | ICD-10-CM | POA: Insufficient documentation

## 2011-06-24 LAB — DIFFERENTIAL
Eosinophils Absolute: 0.2 10*3/uL (ref 0.0–0.7)
Eosinophils Relative: 3 % (ref 0–5)
Lymphocytes Relative: 6 % — ABNORMAL LOW (ref 12–46)
Lymphs Abs: 0.5 10*3/uL — ABNORMAL LOW (ref 0.7–4.0)
Monocytes Absolute: 0.4 10*3/uL (ref 0.1–1.0)
Monocytes Relative: 5 % (ref 3–12)

## 2011-06-24 LAB — CBC
HCT: 34.3 % — ABNORMAL LOW (ref 36.0–46.0)
MCH: 31.5 pg (ref 26.0–34.0)
MCHC: 32.7 g/dL (ref 30.0–36.0)
MCV: 96.3 fL (ref 78.0–100.0)
Platelets: 305 10*3/uL (ref 150–400)
RDW: 16.6 % — ABNORMAL HIGH (ref 11.5–15.5)
WBC: 8.2 10*3/uL (ref 4.0–10.5)

## 2011-06-24 LAB — POCT I-STAT, CHEM 8
Calcium, Ion: 1.1 mmol/L — ABNORMAL LOW (ref 1.12–1.32)
Glucose, Bld: 110 mg/dL — ABNORMAL HIGH (ref 70–99)
HCT: 36 % (ref 36.0–46.0)
Hemoglobin: 12.2 g/dL (ref 12.0–15.0)
TCO2: 27 mmol/L (ref 0–100)

## 2011-06-24 LAB — POCT I-STAT TROPONIN I: Troponin i, poc: 0.02 ng/mL (ref 0.00–0.08)

## 2011-08-20 ENCOUNTER — Encounter (HOSPITAL_COMMUNITY): Payer: Self-pay

## 2011-08-20 ENCOUNTER — Encounter (HOSPITAL_COMMUNITY): Payer: Medicare HMO

## 2011-08-20 LAB — URINE MICROSCOPIC-ADD ON

## 2011-08-20 LAB — BASIC METABOLIC PANEL
Chloride: 102 mEq/L (ref 96–112)
Creatinine, Ser: 0.78 mg/dL (ref 0.50–1.10)
GFR calc Af Amer: >90 mL/min (ref >90)
Potassium: 4 mEq/L (ref 3.5–5.1)

## 2011-08-20 LAB — CBC
Platelets: 170 10*3/uL (ref 150–400)
RBC: 5.01 MIL/uL (ref 3.87–5.11)
WBC: 6.7 10*3/uL (ref 4.0–10.5)

## 2011-08-20 LAB — PROTIME-INR: Prothrombin Time: 13.9 seconds (ref 11.6–15.2)

## 2011-08-20 LAB — URINALYSIS, ROUTINE W REFLEX MICROSCOPIC
Glucose, UA: NEGATIVE mg/dL
Protein, ur: NEGATIVE mg/dL

## 2011-08-20 LAB — DIFFERENTIAL
Basophils Relative: 1 % (ref 0–1)
Eosinophils Absolute: 0.2 10*3/uL (ref 0.0–0.7)
Lymphs Abs: 0.8 10*3/uL (ref 0.7–4.0)
Neutrophils Relative %: 74 % (ref 43–77)

## 2011-08-20 LAB — SURGICAL PCR SCREEN: MRSA, PCR: NEGATIVE

## 2011-08-20 NOTE — Patient Instructions (Signed)
20 [name]  08/20/2011   Your procedure is scheduled on: 08/24/11  Report to [facility] at 12:00PM.  Call this number if you have problems the morning of surgery: [phone]   Remember:   Do not eat food:After Midnight.  Do not drink clear liquids: 4 Hours before arrival.  Take these medicines the morning of surgery with A SIP OF WATER: NONE   Do not wear jewelry, make-up or nail polish.  Do not wear lotions, powders, or perfumes. You may wear deodorant.  Do not shave 48 hours prior to surgery.  Do not bring valuables to the hospital.  Contacts, dentures or bridgework may not be worn into surgery.  Leave suitcase in the car. After surgery it may be brought to your room.  For patients admitted to the hospital, checkout time is 11:00 AM the day of discharge.   Patients discharged the day of surgery will not be allowed to drive home.  Name and phone number of your driver:DAUGHTER  Special Instructions: Incentive Spirometry - Practice and bring it with you on the day of surgery. and CHG Shower Use Special Wash: 1/2 bottle night before surgery and 1/2 bottle morning of surgery.   Please read over the following fact sheets that you were given: Blood Transfusion Information, MRSA Information and Surgical Site Infection Prevention

## 2011-08-24 ENCOUNTER — Encounter (HOSPITAL_COMMUNITY): Payer: Self-pay | Admitting: Anesthesiology

## 2011-08-24 ENCOUNTER — Encounter (HOSPITAL_COMMUNITY)
Admission: RE | Disposition: A | Discharge status: Skilled Nursing Facility | Payer: Self-pay | Source: Ambulatory Visit | Attending: Orthopedic Surgery

## 2011-08-24 ENCOUNTER — Encounter (HOSPITAL_COMMUNITY): Payer: Self-pay | Admitting: Orthopedic Surgery

## 2011-08-24 ENCOUNTER — Inpatient Hospital Stay (HOSPITAL_COMMUNITY): Payer: Medicare HMO | Admitting: Anesthesiology

## 2011-08-24 ENCOUNTER — Inpatient Hospital Stay (HOSPITAL_COMMUNITY)
Admission: RE | Admit: 2011-08-24 | Discharge: 2011-08-27 | DRG: 470 | Disposition: A | Discharge status: Skilled Nursing Facility | Payer: Medicare HMO | Source: Ambulatory Visit | Attending: Orthopedic Surgery | Admitting: Orthopedic Surgery

## 2011-08-24 DIAGNOSIS — M1710 Unilateral primary osteoarthritis, unspecified knee: Principal | ICD-10-CM | POA: Diagnosis present

## 2011-08-24 DIAGNOSIS — K219 Gastro-esophageal reflux disease without esophagitis: Secondary | ICD-10-CM | POA: Diagnosis present

## 2011-08-24 DIAGNOSIS — Z01812 Encounter for preprocedural laboratory examination: Secondary | ICD-10-CM

## 2011-08-24 DIAGNOSIS — Z96659 Presence of unspecified artificial knee joint: Secondary | ICD-10-CM

## 2011-08-24 DIAGNOSIS — E559 Vitamin D deficiency, unspecified: Secondary | ICD-10-CM

## 2011-08-24 DIAGNOSIS — Z954 Presence of other heart-valve replacement: Secondary | ICD-10-CM

## 2011-08-24 HISTORY — PX: TOTAL KNEE ARTHROPLASTY: SHX125

## 2011-08-24 LAB — TYPE AND SCREEN
ABO/RH(D): O POS
Antibody Screen: NEGATIVE

## 2011-08-24 SURGERY — ARTHROPLASTY, KNEE, TOTAL
Anesthesia: General | Site: Knee | Laterality: Right | Wound class: Clean

## 2011-08-24 MED ORDER — METOCLOPRAMIDE HCL 5 MG/ML IJ SOLN: 5.0000 mg | Freq: Three times a day (TID) | INTRAMUSCULAR | Status: DC | PRN

## 2011-08-24 MED ORDER — FERROUS SULFATE 325 (65 FE) MG PO TABS
325.0000 mg | ORAL_TABLET | Freq: Three times a day (TID) | ORAL | Status: DC
  Administered 2011-08-25 – 2011-08-27 (×8): 325 mg via ORAL
  Filled 2011-08-24 (×9): qty 1

## 2011-08-24 MED ORDER — PROPOFOL 10 MG/ML IV EMUL
INTRAVENOUS | Status: DC | PRN
  Administered 2011-08-24: 100 mg via INTRAVENOUS

## 2011-08-24 MED ORDER — CLINDAMYCIN PHOSPHATE 600 MG/50ML IV SOLN
600.0000 mg | Freq: Four times a day (QID) | INTRAVENOUS | Status: AC
  Administered 2011-08-24 – 2011-08-25 (×3): 600 mg via INTRAVENOUS
  Filled 2011-08-24 (×5): qty 50

## 2011-08-24 MED ORDER — DOCUSATE SODIUM 100 MG PO CAPS
100.0000 mg | ORAL_CAPSULE | Freq: Two times a day (BID) | ORAL | Status: DC
  Administered 2011-08-24 – 2011-08-27 (×6): 100 mg via ORAL
  Filled 2011-08-24 (×7): qty 1

## 2011-08-24 MED ORDER — FENTANYL CITRATE 0.05 MG/ML IJ SOLN
INTRAMUSCULAR | Status: DC | PRN
  Administered 2011-08-24 (×4): 50 ug via INTRAVENOUS

## 2011-08-24 MED ORDER — HYDROMORPHONE HCL PF 1 MG/ML IJ SOLN
0.5000 mg | INTRAMUSCULAR | Status: DC | PRN
  Administered 2011-08-25: 0.5 mg via INTRAVENOUS
  Filled 2011-08-24: qty 1

## 2011-08-24 MED ORDER — MIDAZOLAM HCL 5 MG/5ML IJ SOLN
INTRAMUSCULAR | Status: DC | PRN
  Administered 2011-08-24: 2 mg via INTRAVENOUS

## 2011-08-24 MED ORDER — CLINDAMYCIN PHOSPHATE 600 MG/50ML IV SOLN
600.0000 mg | Freq: Once | INTRAVENOUS | Status: DC
  Filled 2011-08-24: qty 50

## 2011-08-24 MED ORDER — HYDROMORPHONE HCL PF 1 MG/ML IJ SOLN: 0.2500 mg | INTRAMUSCULAR | Status: DC | PRN

## 2011-08-24 MED ORDER — ALUMINUM HYDROXIDE GEL 600 MG/5ML PO SUSP: 15.0000 mL | ORAL | Status: DC | PRN

## 2011-08-24 MED ORDER — CHLORHEXIDINE GLUCONATE 4 % EX LIQD
60.0000 mL | Freq: Once | CUTANEOUS | Status: DC
  Filled 2011-08-24: qty 118

## 2011-08-24 MED ORDER — BUPIVACAINE-EPINEPHRINE PF 0.25-1:200000 % IJ SOLN
INTRAMUSCULAR | Status: AC
  Filled 2011-08-24: qty 60

## 2011-08-24 MED ORDER — ALUM & MAG HYDROXIDE-SIMETH 200-200-20 MG/5ML PO SUSP: 30.0000 mL | ORAL | Status: DC | PRN

## 2011-08-24 MED ORDER — ACETAMINOPHEN 10 MG/ML IV SOLN
INTRAVENOUS | Status: AC
  Filled 2011-08-24: qty 100

## 2011-08-24 MED ORDER — RIVAROXABAN 10 MG PO TABS
10.0000 mg | ORAL_TABLET | ORAL | Status: DC
  Administered 2011-08-25: 10 mg via ORAL
  Filled 2011-08-24 (×2): qty 1

## 2011-08-24 MED ORDER — KETOROLAC TROMETHAMINE 30 MG/ML IJ SOLN
INTRAMUSCULAR | Status: DC | PRN
  Administered 2011-08-24: 30 mg via INTRAMUSCULAR

## 2011-08-24 MED ORDER — ONDANSETRON HCL 4 MG/2ML IJ SOLN
INTRAMUSCULAR | Status: DC | PRN
  Administered 2011-08-24: 4 mg via INTRAVENOUS

## 2011-08-24 MED ORDER — ACETAMINOPHEN 650 MG RE SUPP: 650.0000 mg | Freq: Four times a day (QID) | RECTAL | Status: DC | PRN

## 2011-08-24 MED ORDER — METHOCARBAMOL 500 MG PO TABS
500.0000 mg | ORAL_TABLET | Freq: Four times a day (QID) | ORAL | Status: DC | PRN
  Administered 2011-08-25 – 2011-08-27 (×5): 500 mg via ORAL
  Filled 2011-08-24 (×5): qty 1

## 2011-08-24 MED ORDER — HYDROCODONE-ACETAMINOPHEN 7.5-325 MG PO TABS
1.0000 | ORAL_TABLET | ORAL | Status: DC | PRN
  Administered 2011-08-25 (×2): 1 via ORAL
  Administered 2011-08-25: 2 via ORAL
  Administered 2011-08-25: 1 via ORAL
  Administered 2011-08-26 (×3): 2 via ORAL
  Administered 2011-08-27 (×2): 1 via ORAL
  Filled 2011-08-24 (×2): qty 2
  Filled 2011-08-24 (×2): qty 1
  Filled 2011-08-24: qty 2
  Filled 2011-08-24 (×3): qty 1

## 2011-08-24 MED ORDER — CLINDAMYCIN PHOSPHATE 600 MG/4ML IJ SOLN
INTRAMUSCULAR | Status: AC
  Filled 2011-08-24: qty 4

## 2011-08-24 MED ORDER — BISACODYL 10 MG RE SUPP
10.0000 mg | Freq: Every day | RECTAL | Status: DC | PRN
  Administered 2011-08-26: 10 mg via RECTAL
  Filled 2011-08-24: qty 1

## 2011-08-24 MED ORDER — BUPIVACAINE-EPINEPHRINE PF 0.25-1:200000 % IJ SOLN
INTRAMUSCULAR | Status: DC | PRN
  Administered 2011-08-24: 60 mL

## 2011-08-24 MED ORDER — METHOCARBAMOL 100 MG/ML IJ SOLN
500.0000 mg | Freq: Four times a day (QID) | INTRAMUSCULAR | Status: DC | PRN
  Filled 2011-08-24: qty 5

## 2011-08-24 MED ORDER — ACETAMINOPHEN 10 MG/ML IV SOLN
INTRAVENOUS | Status: DC | PRN
  Administered 2011-08-24: 1000 mg via INTRAVENOUS

## 2011-08-24 MED ORDER — MAGNESIUM HYDROXIDE 400 MG/5ML PO SUSP: 30.0000 mL | Freq: Two times a day (BID) | ORAL | Status: DC | PRN

## 2011-08-24 MED ORDER — LACTATED RINGERS IV SOLN: INTRAVENOUS | Status: DC

## 2011-08-24 MED ORDER — PHENOL 1.4 % MT LIQD: 1.0000 | OROMUCOSAL | Status: DC | PRN

## 2011-08-24 MED ORDER — CLINDAMYCIN PHOSPHATE 600 MG/50ML IV SOLN
INTRAVENOUS | Status: DC | PRN
  Administered 2011-08-24: 600 mg via INTRAVENOUS

## 2011-08-24 MED ORDER — MENTHOL 3 MG MT LOZG: 1.0000 | LOZENGE | OROMUCOSAL | Status: DC | PRN

## 2011-08-24 MED ORDER — GENTAMICIN IN SALINE 1.6-0.9 MG/ML-% IV SOLN
INTRAVENOUS | Status: AC
  Filled 2011-08-24: qty 50

## 2011-08-24 MED ORDER — SODIUM CHLORIDE 0.9 % IV SOLN
INTRAVENOUS | Status: DC
  Administered 2011-08-25: 01:00:00 via INTRAVENOUS
  Filled 2011-08-24 (×5): qty 1000

## 2011-08-24 MED ORDER — CHLORHEXIDINE GLUCONATE 4 % EX LIQD: 60.0000 mL | Freq: Once | CUTANEOUS | Status: DC

## 2011-08-24 MED ORDER — SUCCINYLCHOLINE CHLORIDE 20 MG/ML IJ SOLN
INTRAMUSCULAR | Status: DC | PRN
  Administered 2011-08-24: 100 mg via INTRAVENOUS

## 2011-08-24 MED ORDER — METOCLOPRAMIDE HCL 10 MG PO TABS: 5.0000 mg | ORAL_TABLET | Freq: Three times a day (TID) | ORAL | Status: DC | PRN

## 2011-08-24 MED ORDER — ROPIVACAINE HCL 5 MG/ML IJ SOLN
INTRAMUSCULAR | Status: AC
  Filled 2011-08-24: qty 30

## 2011-08-24 MED ORDER — KETOROLAC TROMETHAMINE 30 MG/ML IJ SOLN
INTRAMUSCULAR | Status: AC
  Filled 2011-08-24: qty 1

## 2011-08-24 MED ORDER — ONDANSETRON HCL 4 MG/2ML IJ SOLN: 4.0000 mg | Freq: Four times a day (QID) | INTRAMUSCULAR | Status: DC | PRN

## 2011-08-24 MED ORDER — GENTAMICIN IN SALINE 1.6-0.9 MG/ML-% IV SOLN
INTRAVENOUS | Status: DC | PRN
  Administered 2011-08-24: 80 mg via INTRAVENOUS

## 2011-08-24 MED ORDER — CIPROFLOXACIN IN D5W 400 MG/200ML IV SOLN
INTRAVENOUS | Status: DC | PRN
  Administered 2011-08-24: 400 mg via INTRAVENOUS

## 2011-08-24 MED ORDER — CIPROFLOXACIN IN D5W 400 MG/200ML IV SOLN
INTRAVENOUS | Status: AC
  Filled 2011-08-24: qty 200

## 2011-08-24 MED ORDER — FLEET ENEMA 7-19 GM/118ML RE ENEM: 1.0000 | ENEMA | Freq: Every day | RECTAL | Status: DC | PRN

## 2011-08-24 MED ORDER — SODIUM CHLORIDE 0.9 % IV SOLN
INTRAVENOUS | Status: DC
  Filled 2011-08-24: qty 1000

## 2011-08-24 MED ORDER — PROMETHAZINE HCL 25 MG/ML IJ SOLN: 6.2500 mg | INTRAMUSCULAR | Status: DC | PRN

## 2011-08-24 MED ORDER — BISACODYL 5 MG PO TBEC
10.0000 mg | DELAYED_RELEASE_TABLET | Freq: Every day | ORAL | Status: DC | PRN
  Administered 2011-08-26: 10 mg via ORAL
  Filled 2011-08-24: qty 2

## 2011-08-24 MED ORDER — POLYETHYLENE GLYCOL 3350 17 G PO PACK
17.0000 g | PACK | Freq: Every day | ORAL | Status: DC | PRN
  Filled 2011-08-24: qty 1

## 2011-08-24 MED ORDER — ROPIVACAINE HCL 5 MG/ML IJ SOLN
INTRAMUSCULAR | Status: DC | PRN
  Administered 2011-08-24: 30 mL

## 2011-08-24 MED ORDER — SODIUM CHLORIDE 0.9 % IR SOLN
Status: DC | PRN
  Administered 2011-08-24: 3000 mL

## 2011-08-24 MED ORDER — CIPROFLOXACIN HCL 500 MG PO TABS
500.0000 mg | ORAL_TABLET | Freq: Two times a day (BID) | ORAL | Status: DC
  Administered 2011-08-24 – 2011-08-25 (×2): 500 mg via ORAL
  Filled 2011-08-24 (×5): qty 1

## 2011-08-24 MED ORDER — LACTATED RINGERS IV SOLN
INTRAVENOUS | Status: DC | PRN
  Administered 2011-08-24 (×2): via INTRAVENOUS

## 2011-08-24 SURGICAL SUPPLY — 62 items
BAG ZIPLOCK 12X15 (MISCELLANEOUS) ×2 IMPLANT
BANDAGE ELASTIC 6 VELCRO ST LF (GAUZE/BANDAGES/DRESSINGS) ×2 IMPLANT
BANDAGE ESMARK 6X9 LF (GAUZE/BANDAGES/DRESSINGS) ×1 IMPLANT
BLADE SAW SGTL 13.0X1.19X90.0M (BLADE) ×2 IMPLANT
BNDG ESMARK 6X9 LF (GAUZE/BANDAGES/DRESSINGS) ×2
BOWL SMART MIX CTS (DISPOSABLE) ×2 IMPLANT
CEMENT HV SMART SET (Cement) ×4 IMPLANT
CEMENT TIBIA MBT (Knees) ×1 IMPLANT
CLOTH BEACON ORANGE TIMEOUT ST (SAFETY) ×2 IMPLANT
CUFF TOURN SGL QUICK 34 (TOURNIQUET CUFF) ×1
CUFF TRNQT CYL 34X4X40X1 (TOURNIQUET CUFF) ×1 IMPLANT
DECANTER SPIKE VIAL GLASS SM (MISCELLANEOUS) ×2 IMPLANT
DERMABOND ADVANCED (GAUZE/BANDAGES/DRESSINGS) ×1
DERMABOND ADVANCED .7 DNX12 (GAUZE/BANDAGES/DRESSINGS) ×1 IMPLANT
DRAPE EXTREMITY T 121X128X90 (DRAPE) ×2 IMPLANT
DRAPE POUCH INSTRU U-SHP 10X18 (DRAPES) ×2 IMPLANT
DRAPE U-SHAPE 47X51 STRL (DRAPES) ×2 IMPLANT
DRSG AQUACEL AG ADV 3.5X10 (GAUZE/BANDAGES/DRESSINGS) ×2 IMPLANT
DRSG TEGADERM 4X4.75 (GAUZE/BANDAGES/DRESSINGS) ×2 IMPLANT
DURAPREP 26ML APPLICATOR (WOUND CARE) ×2 IMPLANT
ELECT REM PT RETURN 9FT ADLT (ELECTROSURGICAL) ×2
ELECTRODE REM PT RTRN 9FT ADLT (ELECTROSURGICAL) ×1 IMPLANT
EVACUATOR 1/8 PVC DRAIN (DRAIN) ×2 IMPLANT
FACESHIELD LNG OPTICON STERILE (SAFETY) ×10 IMPLANT
FEMUR SIGMA PS SZ 4.0N R (Femur) ×2 IMPLANT
GAUZE SPONGE 2X2 8PLY STRL LF (GAUZE/BANDAGES/DRESSINGS) ×1 IMPLANT
GLOVE BIO SURGEON STRL SZ7 (GLOVE) ×4 IMPLANT
GLOVE BIOGEL PI IND STRL 7.5 (GLOVE) ×1 IMPLANT
GLOVE BIOGEL PI IND STRL 8 (GLOVE) ×1 IMPLANT
GLOVE BIOGEL PI IND STRL 8.5 (GLOVE) IMPLANT
GLOVE BIOGEL PI INDICATOR 7.5 (GLOVE) ×1
GLOVE BIOGEL PI INDICATOR 8 (GLOVE) ×1
GLOVE BIOGEL PI INDICATOR 8.5 (GLOVE)
GLOVE ECLIPSE 8.0 STRL XLNG CF (GLOVE) ×2 IMPLANT
GLOVE ORTHO TXT STRL SZ7.5 (GLOVE) ×4 IMPLANT
GLOVE SURG SS PI 7.5 STRL IVOR (GLOVE) ×8 IMPLANT
GOWN STRL NON-REIN LRG LVL3 (GOWN DISPOSABLE) ×8 IMPLANT
HANDPIECE INTERPULSE COAX TIP (DISPOSABLE) ×1
IMMOBILIZER KNEE 20 (SOFTGOODS)
IMMOBILIZER KNEE 20 THIGH 36 (SOFTGOODS) IMPLANT
KIT BASIN OR (CUSTOM PROCEDURE TRAY) ×2 IMPLANT
MANIFOLD NEPTUNE II (INSTRUMENTS) ×2 IMPLANT
NDL SAFETY ECLIPSE 18X1.5 (NEEDLE) ×1 IMPLANT
NEEDLE HYPO 18GX1.5 SHARP (NEEDLE) ×1
NS IRRIG 1000ML POUR BTL (IV SOLUTION) ×4 IMPLANT
PACK TOTAL JOINT (CUSTOM PROCEDURE TRAY) ×2 IMPLANT
PATELLA DOME PFC 35MM (Knees) ×2 IMPLANT
PLATE ROT INSERT 10MM SIZE 4 (Plate) ×2 IMPLANT
POSITIONER SURGICAL ARM (MISCELLANEOUS) ×2 IMPLANT
SET HNDPC FAN SPRY TIP SCT (DISPOSABLE) ×1 IMPLANT
SET PAD KNEE POSITIONER (MISCELLANEOUS) ×2 IMPLANT
SPONGE GAUZE 2X2 STER 10/PKG (GAUZE/BANDAGES/DRESSINGS) ×1
SUCTION FRAZIER 12FR DISP (SUCTIONS) ×2 IMPLANT
SUT MNCRL AB 4-0 PS2 18 (SUTURE) ×2 IMPLANT
SUT VIC AB 1 CT1 36 (SUTURE) ×6 IMPLANT
SUT VIC AB 2-0 CT1 27 (SUTURE) ×3
SUT VIC AB 2-0 CT1 TAPERPNT 27 (SUTURE) ×3 IMPLANT
SYR 50ML LL SCALE MARK (SYRINGE) ×2 IMPLANT
TIBIA MBT CEMENT (Knees) ×2 IMPLANT
TOWEL OR 17X26 10 PK STRL BLUE (TOWEL DISPOSABLE) ×4 IMPLANT
TRAY FOLEY CATH 14FRSI W/METER (CATHETERS) ×2 IMPLANT
WATER STERILE IRR 1500ML POUR (IV SOLUTION) ×2 IMPLANT

## 2011-08-24 NOTE — Anesthesia Preprocedure Evaluation (Addendum)
Anesthesia Evaluation  Patient identified by MRN, date of birth, ID band Patient awake    Reviewed: Allergy & Precautions, H&P , NPO status , Patient's Chart, lab work & pertinent test results, reviewed documented beta blocker date and time   Airway Mallampati: II TM Distance: >3 FB Neck ROM: Full    Dental   Pulmonary neg pulmonary ROS, shortness of breath,  clear to auscultation        Cardiovascular neg cardio ROS + Valvular Problems/Murmurs Regular Normal Heart valve surgery Feb 2012, SBE prophalaxis   Neuro/Psych Negative Neurological ROS  Negative Psych ROS   GI/Hepatic negative GI ROS, Neg liver ROS,   Endo/Other  Negative Endocrine ROS  Renal/GU negative Renal ROS  Genitourinary negative   Musculoskeletal negative musculoskeletal ROS (+)   Abdominal   Peds negative pediatric ROS (+)  Hematology negative hematology ROS (+)   Anesthesia Other Findings   Reproductive/Obstetrics negative OB ROS                          Anesthesia Physical Anesthesia Plan  ASA: III  Anesthesia Plan: General and Regional   Post-op Pain Management:    Induction: Intravenous  Airway Management Planned: Oral ETT  Additional Equipment:   Intra-op Plan:   Post-operative Plan: Extubation in OR  Informed Consent: I have reviewed the patients History and Physical, chart, labs and discussed the procedure including the risks, benefits and alternatives for the proposed anesthesia with the patient or authorized representative who has indicated his/her understanding and acceptance.   Dental advisory given  Plan Discussed with: CRNA and Surgeon  Anesthesia Plan Comments:        Anesthesia Quick Evaluation

## 2011-08-24 NOTE — Anesthesia Procedure Notes (Addendum)
Regional Block:  Femoral nerve block  Pre-Anesthetic Checklist: ,, timeout performed, Correct Patient, Correct Site, Correct Laterality, Correct Procedure, Correct Position, site marked, Risks and benefits discussed,  Surgical consent,  Pre-op evaluation,  At surgeon's request and post-op pain management   Prep: chloraprep       Needles:  Injection technique: Single-shot  Needle Type: Stimiplex     Needle Length: 10cm 10 cm     Additional Needles:  Procedures: ultrasound guided Femoral nerve block Narrative:  Start time: 08/24/2011 3:22 PM End time: 08/24/2011 3:31 PM  Performed by: Personally   Additional Notes: Patient tolerated the procedure well without complications

## 2011-08-24 NOTE — Preoperative (Signed)
Beta Blockers   Reason not to administer Beta Blockers:Not Applicable 

## 2011-08-24 NOTE — Op Note (Signed)
Pre-operative diagnosis- Osteoarthritis  Right knee(s)  Post-operative diagnosis- Osteoarthritis Right knee(s)  Surgeon- Madlyn Frankel. Charlann Boxer, MD  Assistant- Freddie Breech, PA-C    Anesthesia-  General and Regional  EBL: Total I/O In: 1000 [I.V.:1000] Out: 350 [Urine:250; Blood:100]  Drains:  One medium Hemovac  Tourniquet time:  Total Tourniquet Time Documented: Thigh (Right) - 38 minutes Complications- None  PATIENT DISPOSITION:  PACU - hemodynamically stable.  Brief Clinical Note Brief Clinical Note  [name] is a 75 y.o. year old female with end stage OA of her right knee with progressively worsening pain and dysfunction. She has constant pain, with activity and at rest and significant functional deficits with difficulties even with ADLs. She has had extensive non-op management including analgesics, injections of cortisone and viscosupplements, and home exercise program, but remains in significant pain with significant dysfunction. She presents now for right Total Knee Arthroplasty.     Procedure in detail---   The patient is brought into the operating room and positioned supine on the operating table. After successful administration of  General and Regional,   a tourniquet is placed high on the  Right thigh(s) and the lower extremity is prepped and draped in the usual sterile fashion. Time out is performed by the operating team and then the  Right lower extremity is wrapped in Esmarch, knee flexed and the tourniquet inflated to 300 mmHg.       A midline incision is made with a ten blade through the subcutaneous tissue to the level of the extensor mechanism. A fresh blade is used to make a medial parapatellar arthrotomy. Soft tissue over the proximal medial tibia is subperiosteally elevated to the joint line with a knife and into the semimembranosus bursa with a Cobb elevator. Soft tissue over the proximal lateral tibia is elevated with attention being paid to avoiding the patellar  tendon on the tibial tubercle. The patella is everted, knee flexed 90 degrees and the ACL and PCL are removed. Findings are consistent with bone on bone arthritis in the medial and patella femoral compartments.        The drill is used to create a starting hole in the distal femur and the canal is thoroughly irrigated with sterile saline to remove the fatty contents. The 3 degree Right  valgus alignment guide is placed into the femoral canal and the distal femoral cutting block is pinned to remove 10mm off the distal femur. Resection is made with an oscillating saw.      The tibia is subluxed forward and the menisci are removed. The extramedullary alignment guide is placed referencing proximally at the medial aspect of the tibial tubercle and distally along the second metatarsal axis and tibial crest. The block is pinned to remove 10mm off the lateral side. Resection is made with an oscillating saw. Size 3is the most appropriate size for the tibia and the proximal tibia is prepared with the modular drill and keel punch for that size.      The femoral sizing guide is placed and size 4 is most appropriate. Rotation is marked off the epicondylar axis and confirmed by creating a rectangular flexion gap at 90 degrees. The size 4 cutting block is pinned in this rotation and the anterior, posterior and chamfer cuts are made with the oscillating saw. The intercondylar block is then placed and that cut is made.      Trial size 3 tibial component, trial size 4 posterior stabilized femur and a 10 mm posterior stabilized  rotating platform insert trial is placed. Full extension is achieved with excellent varus/valgus and anterior/posterior balance throughout full range of motion. The patella is everted and thickness measured to be 35  mm. Free hand resection is taken to 14 mm, a 35 template is placed, lug holes are drilled, trial patella is placed, and it tracks normally. Osteophytes are removed off the posterior femur with  the trial in place. All trials are removed and the cut bone surfaces prepared with pulsatile lavage. Cement is mixed and once ready for implantation, the size 3 tibial implant, size 4 narrow posterior stabilized femoral component, and the size 35 patella are cemented in place and the patella is held with the clamp. The trial insert is placed and the knee held in full extension. All extruded cement is removed and once the cement is hard the permanent 10mm posterior stabilized rotating platform insert is placed into the tibial tray.      The wound is copiously irrigated with saline solution and the extensor mechanism closed over a hemovac drain with #1 vicryl with the knee in flexion. The tourniquet is released for a total tourniquet time of 34  minutes. Flexion against gravity is 130 degrees and the patella tracks normally. Subcutaneous tissue is closed with 2.0 vicryl and subcuticular with running 4.0 Monocryl. The catheter for the Marcaine pain pump is placed and the pump is initiated. The incision is cleaned and dried and steri-strips and a bulky sterile dressing are applied. The limb is placed into a knee immobilizer and the patient is awakened and transported to recovery in stable condition.      Please note that a surgical assistant was a medical necessity for this procedure in order to perform it in a safe and expeditious manner. Surgical assistant was necessary to retract the ligaments and vital neurovascular structures to prevent injury to them and also necessary for proper positioning of the limb to allow for anatomic placement of the prosthesis.

## 2011-08-24 NOTE — Anesthesia Postprocedure Evaluation (Signed)
  Anesthesia Post-op Note  Patient: [name]  Procedure(s) Performed:  TOTAL KNEE ARTHROPLASTY - with Femoral Nerve Block  Patient Location: PACU  Anesthesia Type: General and GA combined with regional for post-op pain  Level of Consciousness: oriented and sedated  Airway and Oxygen Therapy: Patient Spontanous Breathing and Patient connected to nasal cannula oxygen  Post-op Pain: none  Post-op Assessment: Post-op Vital signs reviewed, Patient's Cardiovascular Status Stable, Respiratory Function Stable and Patent Airway  Post-op Vital Signs: stable  Complications: No apparent anesthesia complications

## 2011-08-24 NOTE — Progress Notes (Addendum)
[name], [name]               ACCOUNT NO.:  [id number]      MEDICAL RECORD NO.:  [id number]      LOCATION:                                 FACILITY:      PHYSICIAN:  Madlyn Frankel. Charlann Boxer, M.D.  DATE OF BIRTH:  20-Dec-1934      DATE OF ADMISSION:   DATE OF DISCHARGE:                                 HISTORY & PHYSICAL         DATE OF SURGERY:  August 24, 2011.      ADMITTING DIAGNOSIS:  Right knee osteoarthritis.      HISTORY OF PRESENT ILLNESS:  This is a 76 year old lady with a history   of recent total knee arthroplasty of the left knee that has done very   well, who is now scheduled for total knee arthroplasty of her right   knee.  The surgery, risks, benefits, and aftercare were discussed in   detail with the patient.  Questions invited and answered.  Note that she   is not a candidate for tranexamic acid and will not receive that preop.   She will be going to a nursing facility postoperatively at the [facility], so home medications are not given today.  Her medical doctor is   Elpidio Anis, [place] who works with Dr. Olga Millers.      PAST MEDICAL HISTORY:  Drug allergy to PENICILLIN with unknown reaction.      Serious medical illnesses:   1. History of incompetent heart valve.   2. Also ill-defined possible history of DVT in her 82s.      CURRENT MEDICATIONS:  None.      PREVIOUS SURGERY:  Left total knee arthroplasty, heart valve   replacement, oophorectomy, tonsillectomy, and cataract surgery to both   eyes.      FAMILY HISTORY:  Positive for cancer.      SOCIAL HISTORY:  The patient is widowed.  She lives at home.  She does   not smoke, and drinks socially.      REVIEW OF SYSTEMS:  CENTRAL NERVOUS SYSTEM:  Negative for headache,   blurred vision, or dizziness.  PULMONARY:  Positive for history of sleep   apnea, but she does not use a CPAP.  CARDIOVASCULAR:  Negative for chest   pain or palpitation.  Positive for history of valvular disease with a   heart valve  replacement.  GI:  Negative for ulcers, hepatitis.  GU:   Negative for urinary tract difficulty.  MUSCULOSKELETAL:  Positive as in   HPI.      PHYSICAL EXAMINATION:  VITAL SIGNS:  BP 158/80, respirations 16, pulse   94.   GENERAL APPEARANCE:  This is a well-developed, well-nourished lady in no   acute distress.   HEENT:  Head normocephalic.  Nose patent.  Ears patent.  Pupils equal,   round, reactive to light.  Throat without injection.   NECK:  Supple without adenopathy.  Carotids are 2+ without bruit.   CHEST:  Clear to auscultation.  No rales or rhonchi.  Respirations 16.   HEART:  Regular rate and rhythm at 92  beats per minute without murmur.   ABDOMEN:  Soft with active bowel sounds.  No masses or organomegaly.   NEUROLOGIC:  The patient is alert and oriented to time, place, and   person.  Cranial nerves II-XII grossly intact.   EXTREMITIES:  Shows the left knee status post total knee arthroplasty   with 0 to 125-degree range of motion and neurovascular status intact.   The right knee has 3 reflex contracture with further flexion to 120   degrees.  Neurovascular status intact.      IMPRESSION:  Osteoarthritis, right knee.      PLAN:  Total knee arthroplasty, right knee.     [name]   [facility]  Patient was seen and re-examined in the holding area.  There is no change in the patient's History and Physical.  Madlyn Frankel. Charlann Boxer, MD  08/24/2011, 2:44 PM

## 2011-08-24 NOTE — Op Note (Signed)
Pre-operative diagnosis- Osteoarthritis  Left knee(s)  Post-operative diagnosis- Osteoarthritis Left knee(s)  Surgeon- Molli Hazard D. Charlann Boxer, MD  Assistant- Freddie Breech, PA-C   Anesthesia-  Spinal  EBL:    Drains:  One medium Hemovac  Tourniquet time: * Missing tourniquet times found for documented tourniquets in log:  5289 * Complications- None  PATIENT DISPOSITION:  PACU - hemodynamically stable.  Brief Clinical Note Brief Clinical Note  [name] is a 76 y.o. year old female with end stage OA of her left knee with progressively worsening pain and dysfunction. She has constant pain, with activity and at rest and significant functional deficits with difficulties even with ADLs. She has had extensive non-op management including analgesics, injections of cortisone and viscosupplements, and home exercise program, but remains in significant pain with significant dysfunction. She presents now for left Total Knee Arthroplasty.     Procedure in detail---   The patient is brought into the operating room and positioned supine on the operating table. After successful administration of  Spinal,   a tourniquet is placed high on the  Left thigh(s) and the lower extremity is prepped and draped in the usual sterile fashion. Time out is performed by the operating team and then the  Left lower extremity is wrapped in Esmarch, knee flexed and the tourniquet inflated to 300 mmHg.       A midline incision is made with a ten blade through the subcutaneous tissue to the level of the extensor mechanism. A fresh blade is used to make a medial parapatellar arthrotomy. Soft tissue over the proximal medial tibia is subperiosteally elevated to the joint line with a knife and into the semimembranosus bursa with a Cobb elevator. Soft tissue over the proximal lateral tibia is elevated with attention being paid to avoiding the patellar tendon on the tibial tubercle. The patella is everted, knee flexed 90 degrees and the  ACL and PCL are removed. Findings are consistent with bone on bone changes to the patella femoral joint.      The drill is used to create a starting hole in the distal femur and the canal is thoroughly irrigated with sterile saline to remove the fatty contents. The 3 egree Left  valgus alignment guide is placed into the femoral canal and the distal femoral cutting block is pinned to remove 10 mm off the distal femur. Resection is made with an oscillating saw.      The tibia is subluxed forward and the menisci are removed. The extramedullary alignment guide is placed referencing proximally at the medial aspect of the tibial tubercle and distally along the second metatarsal axis and tibial crest. The block is pinned to remove 10mm off the lateral side. Resection is made with an oscillating saw. Size 2 is the most appropriate size for the tibia and the proximal tibia is prepared with the modular drill and keel punch for that size.      The femoral sizing guide is placed and size 2 is most appropriate. Rotation is marked off the epicondylar axis and confirmed by creating a rectangular flexion gap at 90 degrees. The size 2 cutting block is pinned in this rotation and the anterior, posterior and chamfer cuts are made with the oscillating saw. The intercondylar block is then placed and that cut is made.      Trial size 2 tibial component, trial size 2 posterior stabilized femur and a 12.5 mm posterior stabilized rotating platform insert trial is placed. Full extension is achieved with  excellent varus/valgus and anterior/posterior balance throughout full range of motion. The patella is everted and thickness measured to be 21  mm. Free hand resection is taken to 13 mm, a 35 template is placed, lug holes are drilled, trial patella is placed, and it tracks normally. Osteophytes are removed off the posterior femur with the trial in place. All trials are removed and the cut bone surfaces prepared with pulsatile lavage.  Cement is mixed and once ready for implantation, the size 2 tibial implant, size 2 posterior stabilized femoral component, and the size 35 patella are cemented in place and the patella is held with the clamp. The trial insert is placed and the knee held in full extension. All extruded cement is removed and once the cement is hard the permanent 12.52mm posterior stabilized rotating platform insert is placed into the tibial tray.      The wound is copiously irrigated with saline solution and the extensor mechanism closed over a hemovac drain with #1vicryl. The tourniquet is released for a total tourniquet time of 34  minutes. Flexion against gravity is 130 degrees and the patella tracks normally. Subcutaneous tissue is closed with 2.0 vicryl and subcuticular with running 4.0 Monocryl. The catheter for the Marcaine pain pump is placed and the pump is initiated. The incision is cleaned and dried and steri-strips and a bulky sterile dressing are applied. The limb is placed into a knee immobilizer and the patient is awakened and transported to recovery in stable condition.      Please note that a surgical assistant was a medical necessity for this procedure in order to perform it in a safe and expeditious manner. Surgical assistant was necessary to retract the ligaments and vital neurovascular structures to prevent injury to them and also necessary for proper positioning of the limb to allow for anatomic placement of the prosthesis.

## 2011-08-24 NOTE — Transfer of Care (Signed)
Immediate Anesthesia Transfer of Care Note  Patient: [name]  Procedure(s) Performed:  TOTAL KNEE ARTHROPLASTY - with Femoral Nerve Block  Patient Location: PACU  Anesthesia Type: GA combined with regional for post-op pain  Level of Consciousness: awake and patient cooperative  Airway & Oxygen Therapy: Patient Spontanous Breathing and Patient connected to face mask oxygen  Post-op Assessment: Report given to PACU RN and Post -op Vital signs reviewed and stable  Post vital signs: Reviewed and stable  Complications: No apparent anesthesia complications

## 2011-08-25 LAB — CBC
MCH: 30.3 pg (ref 26.0–34.0)
MCHC: 32.3 g/dL (ref 30.0–36.0)
Platelets: 129 10*3/uL — ABNORMAL LOW (ref 150–400)

## 2011-08-25 LAB — BASIC METABOLIC PANEL
Calcium: 8.2 mg/dL — ABNORMAL LOW (ref 8.4–10.5)
GFR calc Af Amer: >90 mL/min (ref >90)
GFR calc non Af Amer: 83 mL/min — ABNORMAL LOW (ref >90)
Sodium: 136 mEq/L (ref 135–145)

## 2011-08-25 MED ORDER — PHENOL 1.4 % MT LIQD
1.0000 | OROMUCOSAL | Status: DC | PRN
  Filled 2011-08-25: qty 177

## 2011-08-25 MED ORDER — ONDANSETRON HCL 4 MG PO TABS: 4.0000 mg | ORAL_TABLET | Freq: Four times a day (QID) | ORAL | Status: DC | PRN

## 2011-08-25 MED ORDER — METHOCARBAMOL 500 MG PO TABS
500.0000 mg | ORAL_TABLET | Freq: Four times a day (QID) | ORAL | Status: DC | PRN
  Filled 2011-08-25: qty 1

## 2011-08-25 MED ORDER — METOCLOPRAMIDE HCL 10 MG PO TABS: 5.0000 mg | ORAL_TABLET | Freq: Three times a day (TID) | ORAL | Status: DC | PRN

## 2011-08-25 MED ORDER — ACETAMINOPHEN 650 MG RE SUPP: 650.0000 mg | Freq: Four times a day (QID) | RECTAL | Status: DC | PRN

## 2011-08-25 MED ORDER — CIPROFLOXACIN HCL 500 MG PO TABS
500.0000 mg | ORAL_TABLET | Freq: Two times a day (BID) | ORAL | Status: DC
  Administered 2011-08-26 – 2011-08-27 (×2): 500 mg via ORAL
  Filled 2011-08-25 (×5): qty 1

## 2011-08-25 MED ORDER — ALUMINUM HYDROXIDE GEL 600 MG/5ML PO SUSP: 15.0000 mL | ORAL | Status: DC | PRN

## 2011-08-25 MED ORDER — FERROUS SULFATE 325 (65 FE) MG PO TABS
325.0000 mg | ORAL_TABLET | Freq: Three times a day (TID) | ORAL | Status: DC
  Filled 2011-08-25 (×3): qty 1

## 2011-08-25 MED ORDER — ALUM & MAG HYDROXIDE-SIMETH 200-200-20 MG/5ML PO SUSP: 30.0000 mL | ORAL | Status: DC | PRN

## 2011-08-25 MED ORDER — ACETAMINOPHEN 325 MG PO TABS: 650.0000 mg | ORAL_TABLET | Freq: Four times a day (QID) | ORAL | Status: DC | PRN

## 2011-08-25 MED ORDER — SODIUM CHLORIDE 0.9 % IV SOLN
INTRAVENOUS | Status: DC
  Filled 2011-08-25 (×7): qty 1000

## 2011-08-25 MED ORDER — ZOLPIDEM TARTRATE 5 MG PO TABS: 5.0000 mg | ORAL_TABLET | Freq: Every evening | ORAL | Status: DC | PRN

## 2011-08-25 MED ORDER — BISACODYL 5 MG PO TBEC: 10.0000 mg | DELAYED_RELEASE_TABLET | Freq: Every day | ORAL | Status: DC | PRN

## 2011-08-25 MED ORDER — METHOCARBAMOL 100 MG/ML IJ SOLN: 500.0000 mg | Freq: Four times a day (QID) | INTRAVENOUS | Status: DC | PRN

## 2011-08-25 MED ORDER — BISACODYL 10 MG RE SUPP: 10.0000 mg | Freq: Every day | RECTAL | Status: DC | PRN

## 2011-08-25 MED ORDER — MENTHOL 3 MG MT LOZG
1.0000 | LOZENGE | OROMUCOSAL | Status: DC | PRN
  Filled 2011-08-25: qty 9

## 2011-08-25 MED ORDER — METOCLOPRAMIDE HCL 5 MG/ML IJ SOLN: 5.0000 mg | Freq: Three times a day (TID) | INTRAMUSCULAR | Status: DC | PRN

## 2011-08-25 MED ORDER — POLYETHYLENE GLYCOL 3350 17 G PO PACK
17.0000 g | PACK | Freq: Every day | ORAL | Status: DC | PRN
  Filled 2011-08-25: qty 1

## 2011-08-25 MED ORDER — MAGNESIUM HYDROXIDE 400 MG/5ML PO SUSP: 30.0000 mL | Freq: Two times a day (BID) | ORAL | Status: DC | PRN

## 2011-08-25 MED ORDER — ONDANSETRON HCL 4 MG/2ML IJ SOLN: 4.0000 mg | Freq: Four times a day (QID) | INTRAMUSCULAR | Status: DC | PRN

## 2011-08-25 MED ORDER — HYDROMORPHONE HCL PF 1 MG/ML IJ SOLN: 0.5000 mg | INTRAMUSCULAR | Status: DC | PRN

## 2011-08-25 MED ORDER — HYDROCODONE-ACETAMINOPHEN 7.5-325 MG PO TABS
1.0000 | ORAL_TABLET | ORAL | Status: DC | PRN
  Filled 2011-08-25: qty 2

## 2011-08-25 MED ORDER — RIVAROXABAN 10 MG PO TABS
10.0000 mg | ORAL_TABLET | ORAL | Status: DC
  Administered 2011-08-26 – 2011-08-27 (×2): 10 mg via ORAL
  Filled 2011-08-25 (×2): qty 1

## 2011-08-25 MED ORDER — FLEET ENEMA 7-19 GM/118ML RE ENEM: 1.0000 | ENEMA | Freq: Every day | RECTAL | Status: DC | PRN

## 2011-08-25 NOTE — Progress Notes (Signed)
  Subjective: 1 Day Post-Op Procedure(s) (LRB): TOTAL KNEE ARTHROPLASTY (Right)  Patient reports pain as mild.  Objective:   VITALS:   Filed Vitals:   08/25/11 0540  BP: 131/71  Pulse: 77  Temp: 98.3 F (36.8 C)  Resp: 14    Neurovascular intact Sensation intact distally Intact pulses distally Dorsiflexion/Plantar flexion intact Incision: dressing C/D/I No cellulitis present Compartment soft  LABS  Basename 08/25/11 0345  HGB 11.1*  HCT 34.4*  WBC 6.8  PLT 129*     Basename 08/25/11 0345  NA 136  K 3.9  BUN 11  CREATININE 0.72  GLUCOSE 97     Assessment/Plan: 1 Day Post-Op Procedure(s) (LRB): TOTAL KNEE ARTHROPLASTY (Right)   Advance diet Up with therapy D/C IV fluids Discharge to [facility] Thursday   Anastasio Auerbach. Torrence Hammack   PAC  08/25/2011, 9:51 AM

## 2011-08-25 NOTE — Progress Notes (Signed)
Order received and chart reviewed. Spoke with patient and her daughter and we feel that the patient does need OT and that it would work better to start this at the SNF with PT continuing to follow her acutely getting her more mobile so that when she starts OT at the facility she will have greater success.

## 2011-08-25 NOTE — Progress Notes (Signed)
Utilization review completed.  

## 2011-08-25 NOTE — Progress Notes (Signed)
Physical Therapy Evaluation Patient Details Name: [name] MRN: [id number] DOB: Jan 13, 1935 Today's Date: 08/25/2011 1000-1018 and 1035-1050 evIIProblem List:  Patient Active Problem List  Diagnoses  . VITAMIN D DEFICIENCY  . MORBID OBESITY  . AORTIC VALVE DISORDERS  . CONSTIPATION  . OSTEOARTHRITIS  . ARTHRITIS  . FATIGUE  . MURMUR  . HEARTBURN  . WOUND, LEG  . PROBLEMS WITH HEARING  . AORTIC VALVE REPLACEMENT, HX OF    Past Medical History:  Past Medical History  Diagnosis Date  . Vitamin D deficiency   . OA (osteoarthritis)   . Morbid obesity   . Fracture of rib of left side   . Aortic stenosis   . Dyspnea     DYSPNEA WITH EXERTION  . Constipation     OCCASIONAL CONSTIPATION  . Depression    Past Surgical History:  Past Surgical History  Procedure Date  . Cataract surgery   . Tonsillectomy   . Ovary removed secondary to cust   . 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 Tonsillectomy     as child  . Joint replacement     L TOTAL KNEE 05/2011  . Dental surgery     TEETH EXTRACTED    PT Assessment/Plan/Recommendation PT Assessment Clinical Impression Statement: pt s/p RTKA pt can benefit from PT on acute to return to home after STSNF PT Recommendation/Assessment: Patient will need skilled PT in the acute care venue PT Problem List: Decreased strength;Decreased range of motion;Decreased mobility;Pain PT Therapy Diagnosis : Difficulty walking;Acute pain PT Plan PT Frequency: 7X/week PT Treatment/Interventions: DME instruction;Gait training;Therapeutic exercise;Patient/family education PT Recommendation Recommendations for Other Services: OT consult Follow Up Recommendations: Skilled nursing facility Equipment Recommended: Defer to next venue PT Goals  Acute Rehab PT Goals PT Goal Formulation: With patient Time For Goal Achievement: 7 days Pt will go Supine/Side to Sit: with supervision PT Goal: Supine/Side to Sit - Progress: Progressing  toward goal Pt will go Sit to Supine/Side: with supervision PT Goal: Sit to Supine/Side - Progress: Progressing toward goal Pt will Transfer Sit to Stand/Stand to Sit: with min assist PT Transfer Goal: Sit to Stand/Stand to Sit - Progress: Progressing toward goal Pt will Ambulate: 16 - 50 feet;with min assist;with rolling walker PT Goal: Ambulate - Progress: Progressing toward goal  PT Evaluation Precautions/Restrictions  Precautions Precautions: Knee Prior Functioning  Home Living Lives With: Daughter Receives Help From: Family Type of Home: House Home Layout: One level Prior Function Level of Independence: Independent with basic ADLs;Requires assistive device for independence Driving: No Vocation: Retired Producer, television/film/video: Awake/alert Overall Cognitive Status: Appears within functional limits for tasks assessed Orientation Level: Oriented X4 Sensation/Coordination Sensation Light Touch: Appears Intact Extremity Assessment RUE Assessment RUE Assessment: Within Functional Limits LUE Assessment LUE Assessment: Within Functional Limits RLE Assessment RLE Assessment: Within Functional Limits RLE Strength RLE Overall Strength Comments: pt able to perform SLR, Flexion of knee to about 35 degrees, pain limiting LLE Assessment LLE Assessment: Within Functional Limits Mobility (including Balance) Bed Mobility Supine to Sit: 3: Mod assist Supine to Sit Details (indicate cue type and reason): pt used rail Transfers Sit to Stand: 3: Mod assist;From bed;From elevated surface Sit to Stand Details (indicate cue type and reason): VC to reach back to chair, TC for right leg position Ambulation/Gait Ambulation/Gait Assistance: 1: +2 Total assist;Patient percentage (comment) (70) Ambulation/Gait Assistance Details (indicate cue type and reason): VC for posture Assistive device: Rolling walker  Posture/Postural Control Posture/Postural Control: No significant  limitations  Exercise  Total Joint Exercises Quad Sets: AROM;Right;10 reps;Supine Heel Slides: AAROM;Right;10 reps;Supine Hip ABduction/ADduction: AAROM;Right;Supine;10 reps Straight Leg Raises: AAROM;Right;10 reps;Supine End of Session PT - End of Session Equipment Utilized During Treatment: Right knee immobilizer Activity Tolerance: Patient tolerated treatment well;Patient limited by fatigue Patient left: in chair Nurse Communication: Mobility status for transfers;Mobility status for ambulation General Behavior During Session: [facility] for tasks performed Cognition: [facility] for tasks performed  Rada Hay 08/25/2011, 4:37 PM

## 2011-08-25 NOTE — Progress Notes (Signed)
Physical Therapy Treatment Patient Details Name: [name] MRN: [id number] DOB: 08/31/1935 Today's Date: 08/25/2011 1335-1350 ta PT Assessment/Plan  PT - Assessment/Plan PT Plan: Discharge plan remains appropriate;Frequency remains appropriate PT Frequency: 7X/week Follow Up Recommendations: Skilled nursing facility Equipment Recommended: Defer to next venue PT Goals  Acute Rehab PT Goals PT Goal Formulation: With patient Time For Goal Achievement: 7 days Pt will go Supine/Side to Sit: with supervision PT Goal: Supine/Side to Sit - Progress: Progressing toward goal Pt will go Sit to Supine/Side: with supervision PT Goal: Sit to Supine/Side - Progress: Progressing toward goal Pt will Transfer Sit to Stand/Stand to Sit: with min assist PT Transfer Goal: Sit to Stand/Stand to Sit - Progress: Progressing toward goal Pt will Ambulate: 16 - 50 feet;with min assist;with rolling walker PT Goal: Ambulate - Progress: Progressing toward goal  PT Treatment Precautions/Restrictions  Precautions Precautions: Knee Knee Immobilizer: Discontinue once straight leg raise with < 10 degree lag Mobility (including Balance) Bed Mobility Supine to Sit: 3: Mod assist  Transfers Sit to Stand: From chair/3-in-1;3: Mod assist Sit to Stand Details (indicate cue type and reason): pt requred manual assist to stand up Stand Pivot Transfers: 3: Mod assist Stand Pivot Transfer Details (indicate cue type and reason): VC for standing erect Ambulation/Gait Ambulation/Gait Assistance: 3: Mod assist Ambulation/Gait Assistance Details (indicate cue type and reason): pt took about 5 steps backward to bed Assistive device: Rolling walker  Posture/Postural Control Posture/Postural Control: No significant limitations Exercise   End of Session PT - End of Session Equipment Utilized During Treatment: Left knee immobilizer Activity Tolerance: Patient tolerated treatment well Patient left: in bed Nurse  Communication: Mobility status for transfers General Behavior During Session: [facility] for tasks performed Cognition: [facility] for tasks performed  Rada Hay 08/25/2011, 4:53 PM

## 2011-08-25 NOTE — Plan of Care (Signed)
Problem: Consults Goal: Diagnosis- Total Joint Replacement Primary Total Knee     

## 2011-08-25 NOTE — Progress Notes (Signed)
Clinical Social Work - Psychosocial Assessment Note Order written 08/24/11 by MD for nursing home placement. CSW met with [name] at bedside today. [name] lives with family. Her daughter was at bedside during assessment. [name] has been to [facility] SNF rehab in the past and would like to go there again following this hospital stay. FL2 completed and placed in chart for MD signature. Referral has been sent to [facility]. CSW has received response from [facility] that they will accept [name] for SNF rehab. CSW notified [facility] that discharge is expected to be on Thursday. [facility] will admit [name] on Thursday if she is discharged. [name] has [employer]. CSW will need to submit PT evaluation/notes for insurance review before they will approve SNF. Currently no PT evaluation available in [facility]. OT note indicates that they have deferred OT evaluation to the SNF. CSW will check with [employer] to see if OT is required as well as PT. [name] has declined a SNF list be given to her. She states she contacted [facility] prior to this surgery and was told they would accept her for rehab. [name] has an existing pasarr# in Provider Link. This number was sent to [facility] today along with referral information. Kingsley Spittle, LCSW Clinical Social Worker [phone].

## 2011-08-25 NOTE — Plan of Care (Signed)
Problem: Consults Goal: Diagnosis- Total Joint Replacement Outcome: Progressing Revision Total Knee     

## 2011-08-26 LAB — BASIC METABOLIC PANEL
CO2: 26 mEq/L (ref 19–32)
Calcium: 8.3 mg/dL — ABNORMAL LOW (ref 8.4–10.5)
Chloride: 103 mEq/L (ref 96–112)
Sodium: 136 mEq/L (ref 135–145)

## 2011-08-26 LAB — CBC
Platelets: 153 10*3/uL (ref 150–400)
RBC: 3.6 MIL/uL — ABNORMAL LOW (ref 3.87–5.11)
WBC: 6.3 10*3/uL (ref 4.0–10.5)

## 2011-08-26 NOTE — Progress Notes (Signed)
Physical Therapy Treatment Patient Details Name: [name] MRN: [id number] DOB: 08-Jan-1935 Today's Date: 08/26/2011 [id number] EG PT Assessment/Plan  PT - Assessment/Plan PT Plan: Discharge plan remains appropriate;Frequency remains appropriate PT Goals  Acute Rehab PT Goals PT Goal: Supine/Side to Sit - Progress: Progressing toward goal PT Goal: Sit to Supine/Side - Progress: Progressing toward goal PT Transfer Goal: Sit to Stand/Stand to Sit - Progress: Progressing toward goal PT Goal: Ambulate - Progress: Progressing toward goal  PT Treatment Precautions/Restrictions  Precautions Precautions: Knee Knee Immobilizer: Discontinue once straight leg raise with < 10 degree lag Mobility (including Balance)  Transfers Transfers: Yes Sit to Stand: 3: Mod assist;From chair/3-in-1 Sit to Stand Details (indicate cue type and reason): pt has most difficulty with standing up, once standing pt able to assist with pericare with assistance Stand Pivot Transfers: 3: Mod assist Stand Pivot Transfer Details (indicate cue type and reason): pt pivoted recliner to [facility] and back to recliner, verbal cues for standing erect. Ambulation/Gait Ambulation/Gait: No Assistive device: Rolling walker  Posture/Postural Control Posture/Postural Control: Postural limitations Postural Limitations: pt leans forward while standing Exercise  Total Joint Exercises Ankle Circles/Pumps: AROM;Right;10 reps;Seated Hip ABduction/ADduction: AAROM;Right;20 reps;Seated Long Arc Quad: AAROM;Right;20 reps;Seated End of Session PT - End of Session Equipment Utilized During Treatment: Right knee immobilizer Activity Tolerance: Patient tolerated treatment well Patient left: in chair;with call bell in reach General Behavior During Session: [facility] for tasks performed Cognition: [facility] for tasks performed  Rada Hay 08/26/2011, 1:20 PM

## 2011-08-26 NOTE — Progress Notes (Signed)
Physical Therapy Treatment Patient Details Name: [name] MRN: [id number] DOB: [birth year] Today's Date: 08/26/2011 [id number]  1G PT Assessment/Plan  PT - Assessment/Plan PT Plan: Discharge plan remains appropriate;Frequency remains appropriate PT Goals  Acute Rehab PT Goals PT Goal: Supine/Side to Sit - Progress: Progressing toward goal PT Goal: Sit to Supine/Side - Progress: Progressing toward goal PT Transfer Goal: Sit to Stand/Stand to Sit - Progress: Progressing toward goal PT Goal: Ambulate - Progress: Progressing toward goal  PT Treatment Precautions/Restrictions  Precautions Precautions: Knee Knee Immobilizer: Discontinue once straight leg raise with < 10 degree lag Mobility (including Balance) Bed Mobility Bed Mobility: No Supine to Sit: 3: Mod assist;HOB elevated (Comment degrees);With rails Transfers Transfers: Yes Sit to Stand: 3: Mod assist;With upper extremity assist;From bed Sit to Stand Details (indicate cue type and reason): pt has dififculty with standin up Stand Pivot Transfers: 3: Mod assist Ambulation/Gait Ambulation/Gait: Yes Ambulation/Gait Assistance: 1: +2 Total assist;Patient percentage (comment) (60) Ambulation/Gait Assistance Details (indicate cue type and reason): pt takes extra time to take steps.VC for sequencwe, posture Ambulation Distance (Feet): 8 Feet Assistive device: Rolling walker Gait Pattern: Step-to pattern;Decreased stride length Gait velocity: forward flexed posture  Posture/Postural Control Posture/Postural Control: Postural limitations Postural Limitations: pt leans forward while standing Exercise    End of Session PT - End of Session Equipment Utilized During Treatment: Right knee immobilizer Activity Tolerance: Patient limited by fatigue Patient left: in chair General Behavior During Session: [facility] for tasks performed Cognition: [facility] for tasks performed  Rada Hay 08/26/2011, 1:09 PM

## 2011-08-26 NOTE — Progress Notes (Signed)
Spoke with pt and daughter yesterday at 12:57. Agreed to defer OT to SNF.

## 2011-08-26 NOTE — Progress Notes (Signed)
  Subjective: 2 Days Post-Op Procedure(s) (LRB): TOTAL KNEE ARTHROPLASTY (Right)  Patient reports pain as mild.  Objective:   VITALS:   Filed Vitals:   08/26/11 0540  BP: 130/67  Pulse: 91  Temp: 98.6 F (37 C)  Resp: 17    Neurovascular intact Sensation intact distally Intact pulses distally Dorsiflexion/Plantar flexion intact Incision: dressing C/D/I No cellulitis present Compartment soft  LABS  Basename 08/26/11 0437 08/25/11 0345  HGB 10.9* 11.1*  HCT 34.6* 34.4*  WBC 6.3 6.8  PLT 153 129*     Basename 08/26/11 0437 08/25/11 0345  NA 136 136  K 3.9 3.9  BUN 15 11  CREATININE 0.80 0.72  GLUCOSE 119* 97   Assessment/Plan: 2 Days Post-Op Procedure(s) (LRB): TOTAL KNEE ARTHROPLASTY (Right)   Advance diet Up with therapy Plan for discharge tomorrow Discharge to SNF   Anastasio Auerbach. Unika Nazareno   PAC  08/26/2011, 8:37 AM

## 2011-08-26 NOTE — Progress Notes (Signed)
Patient plans SNF rehab upon discharge from hospital. This case has been referred to CSW

## 2011-08-26 NOTE — Progress Notes (Signed)
CSW sent physical therapy evaluation and treatment notes to [facility] SNF via care finder pro. [facility] is able to admit [name] tomorrow if she is medically ready and insurance approval is received. Kingsley Spittle, LCSW Clinical Social Work [phone].

## 2011-08-27 ENCOUNTER — Encounter (HOSPITAL_COMMUNITY): Payer: Self-pay | Admitting: Orthopedic Surgery

## 2011-08-27 LAB — CBC
HCT: 33.9 % — ABNORMAL LOW (ref 36.0–46.0)
MCV: 96 fL (ref 78.0–100.0)
Platelets: 129 10*3/uL — ABNORMAL LOW (ref 150–400)
RBC: 3.53 MIL/uL — ABNORMAL LOW (ref 3.87–5.11)
WBC: 7.5 10*3/uL (ref 4.0–10.5)

## 2011-08-27 MED ORDER — POLYETHYLENE GLYCOL 3350 17 G PO PACK: 17.0000 g | PACK | Freq: Every day | ORAL | Status: AC | PRN

## 2011-08-27 MED ORDER — FERROUS SULFATE 325 (65 FE) MG PO TABS: 325.0000 mg | ORAL_TABLET | Freq: Three times a day (TID) | ORAL | Status: DC

## 2011-08-27 MED ORDER — METHOCARBAMOL 500 MG PO TABS: 500.0000 mg | ORAL_TABLET | Freq: Four times a day (QID) | ORAL | Status: AC | PRN

## 2011-08-27 MED ORDER — CIPROFLOXACIN HCL 500 MG PO TABS: 500.0000 mg | ORAL_TABLET | Freq: Two times a day (BID) | ORAL | Status: AC

## 2011-08-27 MED ORDER — RIVAROXABAN 10 MG PO TABS: 10.0000 mg | ORAL_TABLET | ORAL | Status: DC

## 2011-08-27 MED ORDER — HYDROCODONE-ACETAMINOPHEN 7.5-325 MG PO TABS: 1.0000 | ORAL_TABLET | ORAL | Status: AC | PRN

## 2011-08-27 MED ORDER — DSS 100 MG PO CAPS: 100.0000 mg | ORAL_CAPSULE | Freq: Two times a day (BID) | ORAL | Status: AC

## 2011-08-27 NOTE — Progress Notes (Signed)
Physical Therapy Treatment Patient Details Name: [name] MRN: [id number] DOB: [birth year] Today's Date: 08/27/2011 9:35 - 10:00 1 gt  1 te PT Assessment/Plan  PT - Assessment/Plan Comments on Treatment Session: pt plans to D/C to SNF today PT Plan: Discharge plan remains appropriate PT Frequency: 7X/week Follow Up Recommendations: Skilled nursing facility Equipment Recommended: Defer to next venue PT Goals  Acute Rehab PT Goals PT Goal Formulation: With patient Time For Goal Achievement: 7 days Pt will go Supine/Side to Sit: with supervision PT Goal: Supine/Side to Sit - Progress: Progressing toward goal Pt will go Sit to Supine/Side: with supervision PT Goal: Sit to Supine/Side - Progress: Progressing toward goal Pt will Transfer Sit to Stand/Stand to Sit: with min assist PT Transfer Goal: Sit to Stand/Stand to Sit - Progress: Progressing toward goal Pt will Ambulate: 16 - 50 feet;with min assist;with rolling walker PT Goal: Ambulate - Progress: Progressing toward goal  PT Treatment Precautions/Restrictions  WBAT    KI until 10 active SLR Mobility (including Balance) Bed Mobility Bed Mobility: No Transfers Transfers: Yes Sit to Stand: 4: Min assist;From chair/3-in-1 Sit to Stand Details (indicate cue type and reason): 25% VC's hand placement Stand Pivot Transfers: 4: Min assist Stand Pivot Transfer Details (indicate cue type and reason): 25% VC's for hand placement Ambulation/Gait Ambulation/Gait: Yes Ambulation/Gait Assistance: 3: Mod assist Ambulation/Gait Assistance Details (indicate cue type and reason): 50% VC's to increase posture and increase heel strike R LE Ambulation Distance (Feet): 12 Feet Assistive device: Rolling walker Gait Pattern: Step-to pattern Gait velocity: very slow gait Stairs: No Wheelchair Mobility Wheelchair Mobility: No    Exercise  Total Joint Exercises Ankle Circles/Pumps: AROM;Both;10 reps Quad Sets: AROM;Both;10 reps Gluteal  Sets: AROM;Both;10 reps Towel Squeeze: AROM;Both;10 reps Short Arc QuadBarbaraann Boys;Right;10 reps Heel Slides: AAROM;Right;10 reps Hip ABduction/ADduction: AAROM;Right;10 reps Straight Leg Raises: AAROM;Right;10 reps End of Session PT - End of Session Equipment Utilized During Treatment: Gait belt;Right knee immobilizer Activity Tolerance: Patient tolerated treatment well Patient left: in chair;with call bell in reach General Behavior During Session: [facility] for tasks performed Cognition: [facility] for tasks performed  Armando Reichert 08/27/2011, 1:12 PM

## 2011-08-27 NOTE — Progress Notes (Signed)
Mrs. No is discharged to [facility] SNF today. CSW has received permission from [employer] at [place] to arrange ambulance transport. RN has been notified and ambulance has been called. Kingsley Spittle, LCSW Clinical Social Work [phone].

## 2011-08-27 NOTE — Progress Notes (Signed)
Subjective: 3 Days Post-Op Procedure(s) (LRB): TOTAL KNEE ARTHROPLASTY (Right)  Patient reports pain as mild. Controlled well with medication.  Objective:   VITALS:   Filed Vitals:   08/27/11 0625  BP: 166/80  Pulse: 102  Temp: 98.3 F (36.8 C)  Resp: 24    Neurovascular intact Dorsiflexion/Plantar flexion intact Incision: dressing C/D/I No cellulitis present Compartment soft   LABS  Basename 08/27/11 0413 08/26/11 0437 08/25/11 0345  HGB 10.8* 10.9* 11.1*  HCT 33.9* 34.6* 34.4*  WBC 7.5 6.3 6.8  PLT 129* 153 129*     Basename 08/26/11 0437 08/25/11 0345  NA 136 136  K 3.9 3.9  BUN 15 11  CREATININE 0.80 0.72  GLUCOSE 119* 97    No results found for this basename: LABPT:2,INR:2 in the last 72 hours   Assessment/Plan: 3 Days Post-Op Procedure(s) (LRB): TOTAL KNEE ARTHROPLASTY (Right)   Advance diet Up with therapy Discharge to SNF   [name]. Faatimah Spielberg   PAC  08/27/2011, 9:42 AM

## 2011-08-27 NOTE — Discharge Summary (Signed)
Physician Discharge Summary  Patient ID: [name] MRN: [id number] DOB/AGE: 74-Dec-1936 74 y.o.  Admit date: 08/24/2011 Discharge date: 08/27/2011  Procedures:  Procedure(s) (LRB): TOTAL KNEE ARTHROPLASTY (Right)  Attending Physician: Shelda Pal   Admission Diagnoses: Right knee OA / pain  Discharge Diagnoses:  S/P right TKA GERD OA Fatigue Previous Aortic valve replacement.   Procedures:  Procedure(s) (LRB): TOTAL KNEE ARTHROPLASTY (Right)  Discharged Condition: good  Hospital Course:  Patient underwent the above stated procedure on 08/24/2011. Patient tolerated the procedure well and brought to the recover room in good condition and subsequently to the floor.  POD #1 - 08/25/11 AF, VSS Neurovascular intact, Sensation intact distally, Intact pulses distally, Dorsiflexion/Plantar flexion intact, Incision: dressing C/D/I, No cellulitis present and Compartment soft. Pain is well controlled. Pt's foley was removed, HV drain d/c'ed and IV changed to saline lock.  LABS   Basename  08/25/11 0345   HGB  11.1*   HCT  34.4*     POD #2 - 08/26/11 AF, VSS Neurovascular intact, Sensation intact distally, Intact pulses distally, Dorsiflexion/Plantar flexion intact, Incision: dressing C/D/I, No cellulitis present and Compartment soft. Pain is well controlled.   LABS   Basename  08/26/11 0437    HGB  10.9*    HCT  34.6*       POD #3 - 08/27/2011 AF, VSS Neurovascular intact, Dorsiflexion/Plantar flexion intact, Incision: dressing C/D/I, No cellulitis present and Compartment soft. Pain is well controlled. Pt felt to be doing well enough to be discharged to SNF today.  LABS   Basename  08/27/11 0413     HGB  10.8*     HCT  33.9*      Disposition: SNF  Discharge Orders    Future Orders Please Complete By Expires   Call MD / Call 911      Comments:   If you experience chest pain or shortness of breath, CALL 911 and be transported to the hospital emergency  room.  If you develope a fever above 101 F, pus (white drainage) or increased drainage or redness at the wound, or calf pain, call your surgeon's office.   Constipation Prevention      Comments:   Drink plenty of fluids.  Prune juice may be helpful.  You may use a stool softener, such as Colace (over the counter) 100 mg twice a day.  Use MiraLax (over the counter) for constipation as needed.   Increase activity slowly as tolerated      Weight Bearing as taught in Physical Therapy      Comments:   Use a walker or crutches as instructed.   Discharge instructions      Comments:   Keep the area dry and clean until follow up in 2 weeks.   TED hose      Comments:   Use stockings (TED hose) for 2 weeks on both leg(s).  You may remove them at night for sleeping.   Change dressing      Comments:   Maintain surgical dressing for 8 days, then replace with gauze and tape. Keep the area dry and clean until follow up in 2 weeks.      Current Discharge Medication List    START taking these medications   Details  ciprofloxacin (CIPRO) 500 MG tablet Take 1 tablet (500 mg total) by mouth 2 (two) times daily. Qty: 6 tablet, Refills: 0    docusate sodium 100 MG CAPS Take 100 mg by mouth 2 (two)  times daily. Qty: 10 capsule    ferrous sulfate 325 (65 FE) MG tablet Take 1 tablet (325 mg total) by mouth 3 (three) times daily after meals.    HYDROcodone-acetaminophen (NORCO) 7.5-325 MG per tablet Take 1-2 tablets by mouth every 4 (four) hours as needed for pain. Qty: 120 tablet, Refills: 0    methocarbamol (ROBAXIN) 500 MG tablet Take 1 tablet (500 mg total) by mouth every 6 (six) hours as needed (muscle spasms). Qty: 50 tablet, Refills: 0    polyethylene glycol (MIRALAX / GLYCOLAX) packet Take 17 g by mouth daily as needed. Qty: 14 each    rivaroxaban (XARELTO) 10 MG TABS tablet Take 1 tablet (10 mg total) by mouth daily. Qty: 12 tablet, Refills: 0         Signed: Anastasio Auerbach. Lourine Alberico    PAC  08/27/2011, 9:56 AM

## 2011-08-31 NOTE — H&P (Signed)
[name], [name]               ACCOUNT NO.:  [id number]  MEDICAL RECORD NO.:  [id number]  LOCATION:                                 FACILITY:  PHYSICIAN:  Madlyn Frankel. Charlann Boxer, M.D.  DATE OF BIRTH:  [birth year]  DATE OF ADMISSION: DATE OF DISCHARGE:                             HISTORY & PHYSICAL   DATE OF SURGERY:  August 24, 2011.  ADMITTING DIAGNOSIS:  Right knee osteoarthritis.  HISTORY OF PRESENT ILLNESS:  This is a 76 year old lady with a history of recent total knee arthroplasty of the left knee that has done very well, who is now scheduled for total knee arthroplasty of her right knee.  The surgery, risks, benefits, and aftercare were discussed in detail with the patient.  Questions invited and answered.  Note that she is not a candidate for tranexamic acid and will not receive that preop. She will be going to a nursing facility postoperatively at the [facility], so home medications are not given today.  Her medical doctor is Elpidio Anis, [place] who works with Dr. Olga Millers.  PAST MEDICAL HISTORY:  Drug allergy to PENICILLIN with unknown reaction.  Serious medical illnesses: 1. History of incompetent heart valve. 2. Also ill-defined possible history of DVT in her 81s.  CURRENT MEDICATIONS:  None.  PREVIOUS SURGERY:  Left total knee arthroplasty, heart valve replacement, oophorectomy, tonsillectomy, and cataract surgery to both eyes.  FAMILY HISTORY:  Positive for cancer.  SOCIAL HISTORY:  The patient is widowed.  She lives at home.  She does not smoke, and drinks socially.  REVIEW OF SYSTEMS:  CENTRAL NERVOUS SYSTEM:  Negative for headache, blurred vision, or dizziness.  PULMONARY:  Positive for history of sleep apnea, but she does not use a CPAP.  CARDIOVASCULAR:  Negative for chest pain or palpitation.  Positive for history of valvular disease with a heart valve replacement.  GI:  Negative for ulcers, hepatitis.  GU: Negative for urinary tract difficulty.   MUSCULOSKELETAL:  Positive as in HPI.  PHYSICAL EXAMINATION:  VITAL SIGNS:  BP 158/80, respirations 16, pulse 94. GENERAL APPEARANCE:  This is a well-developed, well-nourished lady in no acute distress. HEENT:  Head normocephalic.  Nose patent.  Ears patent.  Pupils equal, round, reactive to light.  Throat without injection. NECK:  Supple without adenopathy.  Carotids are 2+ without bruit. CHEST:  Clear to auscultation.  No rales or rhonchi.  Respirations 16. HEART:  Regular rate and rhythm at 92 beats per minute without murmur. ABDOMEN:  Soft with active bowel sounds.  No masses or organomegaly. NEUROLOGIC:  The patient is alert and oriented to time, place, and person.  Cranial nerves II-XII grossly intact. EXTREMITIES:  Shows the left knee status post total knee arthroplasty with 0 to 125-degree range of motion and neurovascular status intact. The right knee has 3 reflex contracture with further flexion to 120 degrees.  Neurovascular status intact.  IMPRESSION:  Osteoarthritis, right knee.  PLAN:  Total knee arthroplasty, right knee.     Jaquelyn Bitter. Chabon, P.A.   ______________________________ Madlyn Frankel Charlann Boxer, M.D.    SJC/MEDQ  D:  08/19/2011  T:  08/19/2011  Job:  [id number]  Electronically  Signed by Jodene Nam P.A. on 08/24/2011 09:41:42 AM Electronically Signed by Durene Romans M.D. on 08/31/2011 09:35:14 AM

## 2011-11-04 IMAGING — CR DG CHEST 2V
2 series · 2 of 2 positions shown · non-contrast
Comparison: 11/21/2010.

CLINICAL DATA: Postop CABG and aortic valve replacement.

CHEST - 2 VIEW

[w chest pa]
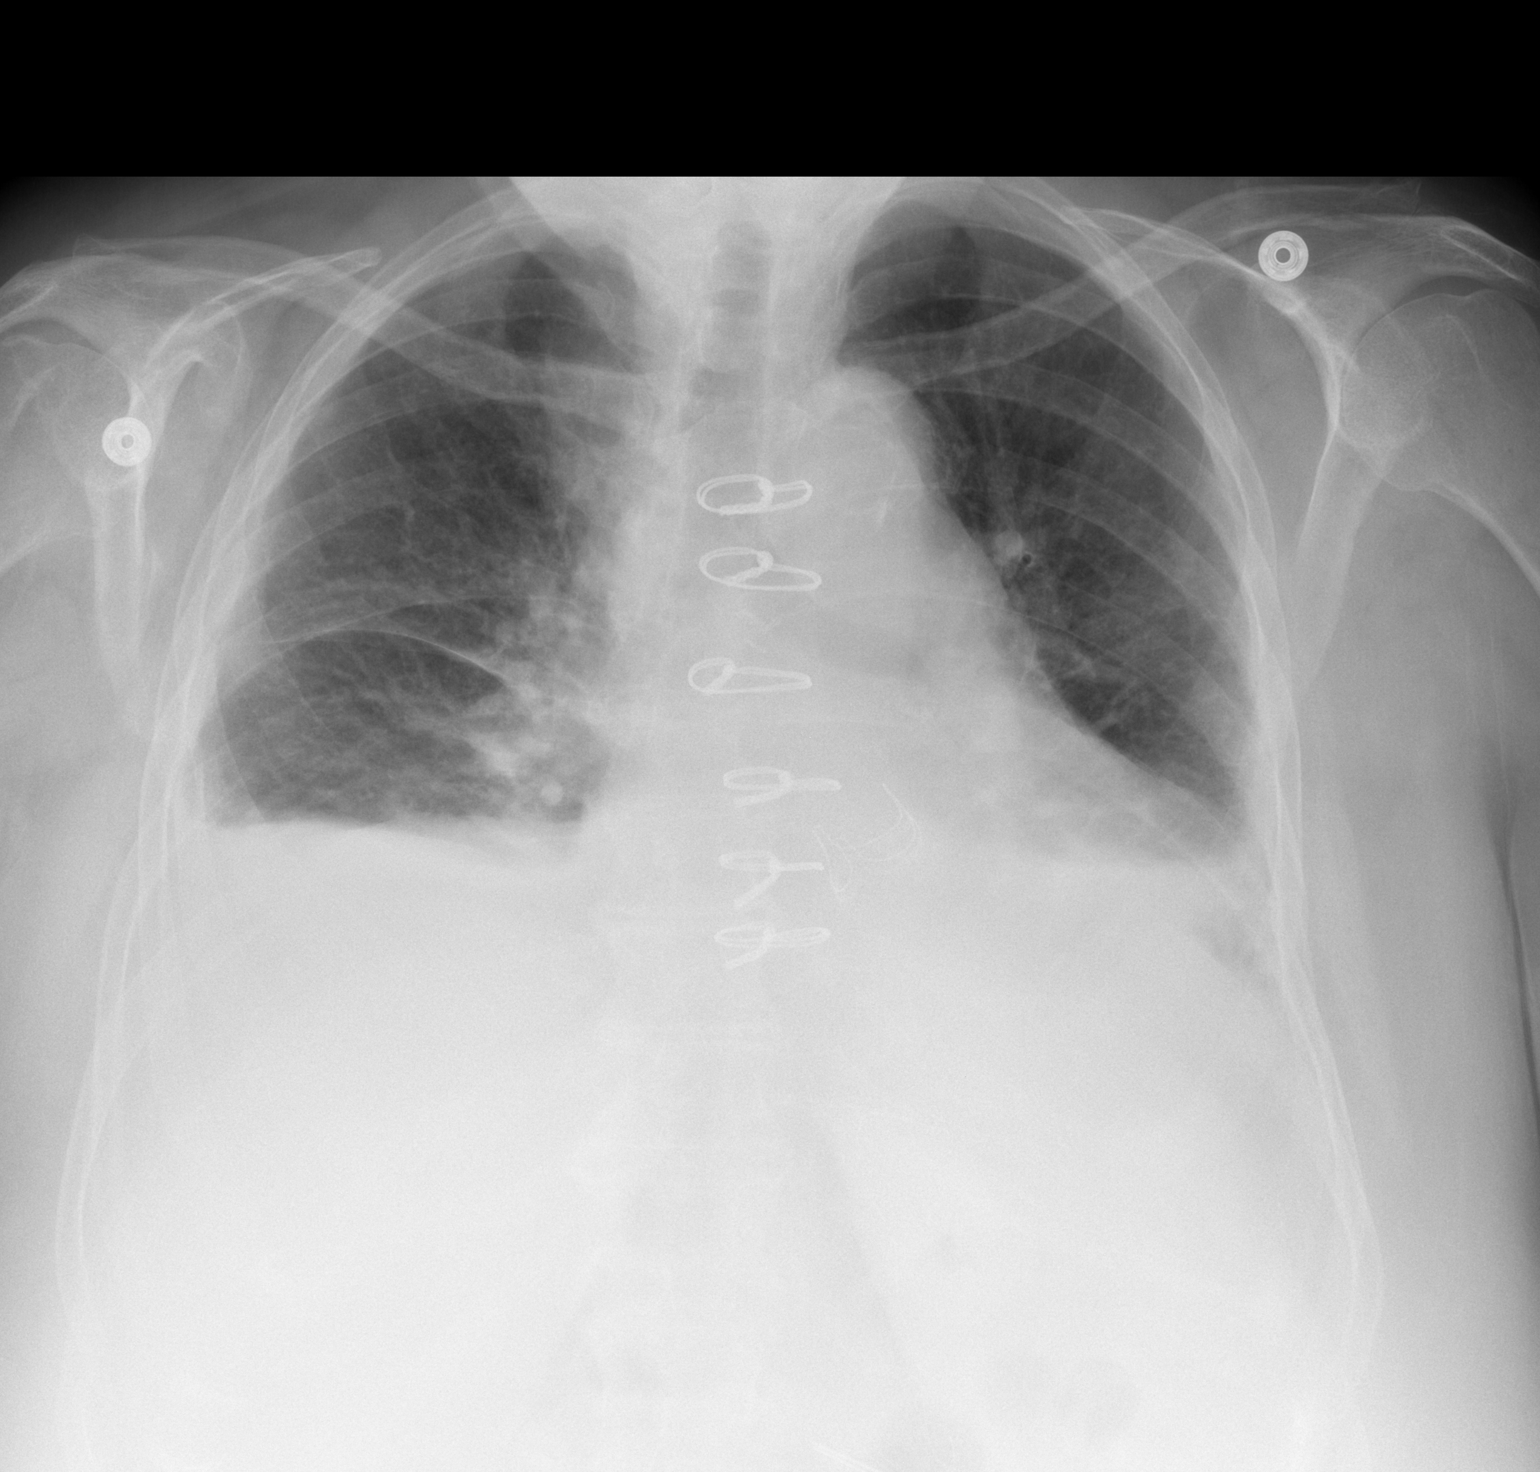

[w chest lat]
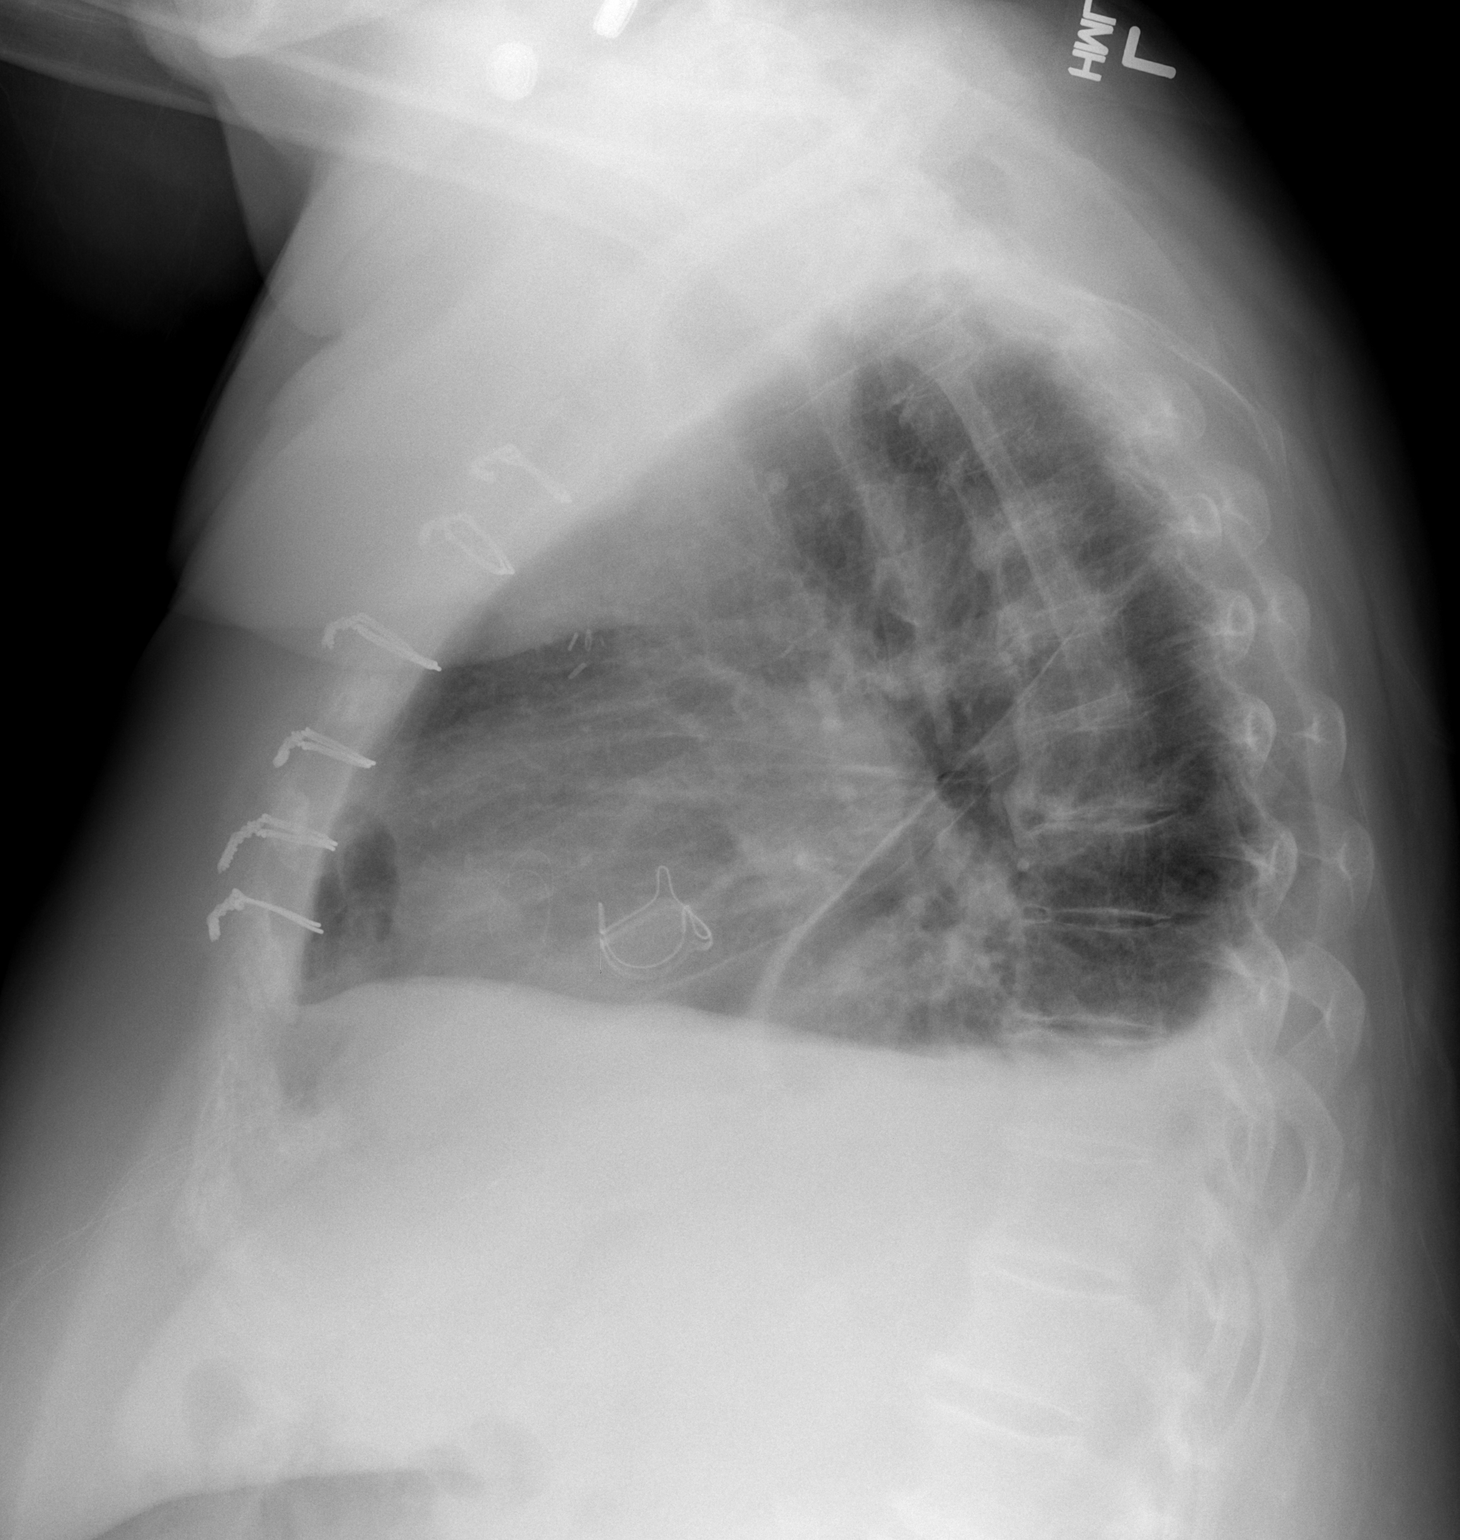

[2 of 2 positions shown; findings below may reference images not displayed]

FINDINGS: Right IJ sheath has been removed.  There are small
bilateral pleural effusions with associated bibasilar atelectasis.
A small amount of retrosternal air is noted on the lateral view.
There is no pneumothorax.  The heart size and mediastinal contours
appear stable.
IMPRESSION: Small pleural effusions and bibasilar atelectasis.  No
pneumothorax.

## 2011-11-05 IMAGING — CR DG CHEST 2V
2 series · 2 of 2 positions shown · non-contrast
Comparison: 11/22/2010

CLINICAL DATA: Status post aortic valve replacement surgery.

CHEST - 2 VIEW

[w chest pa]
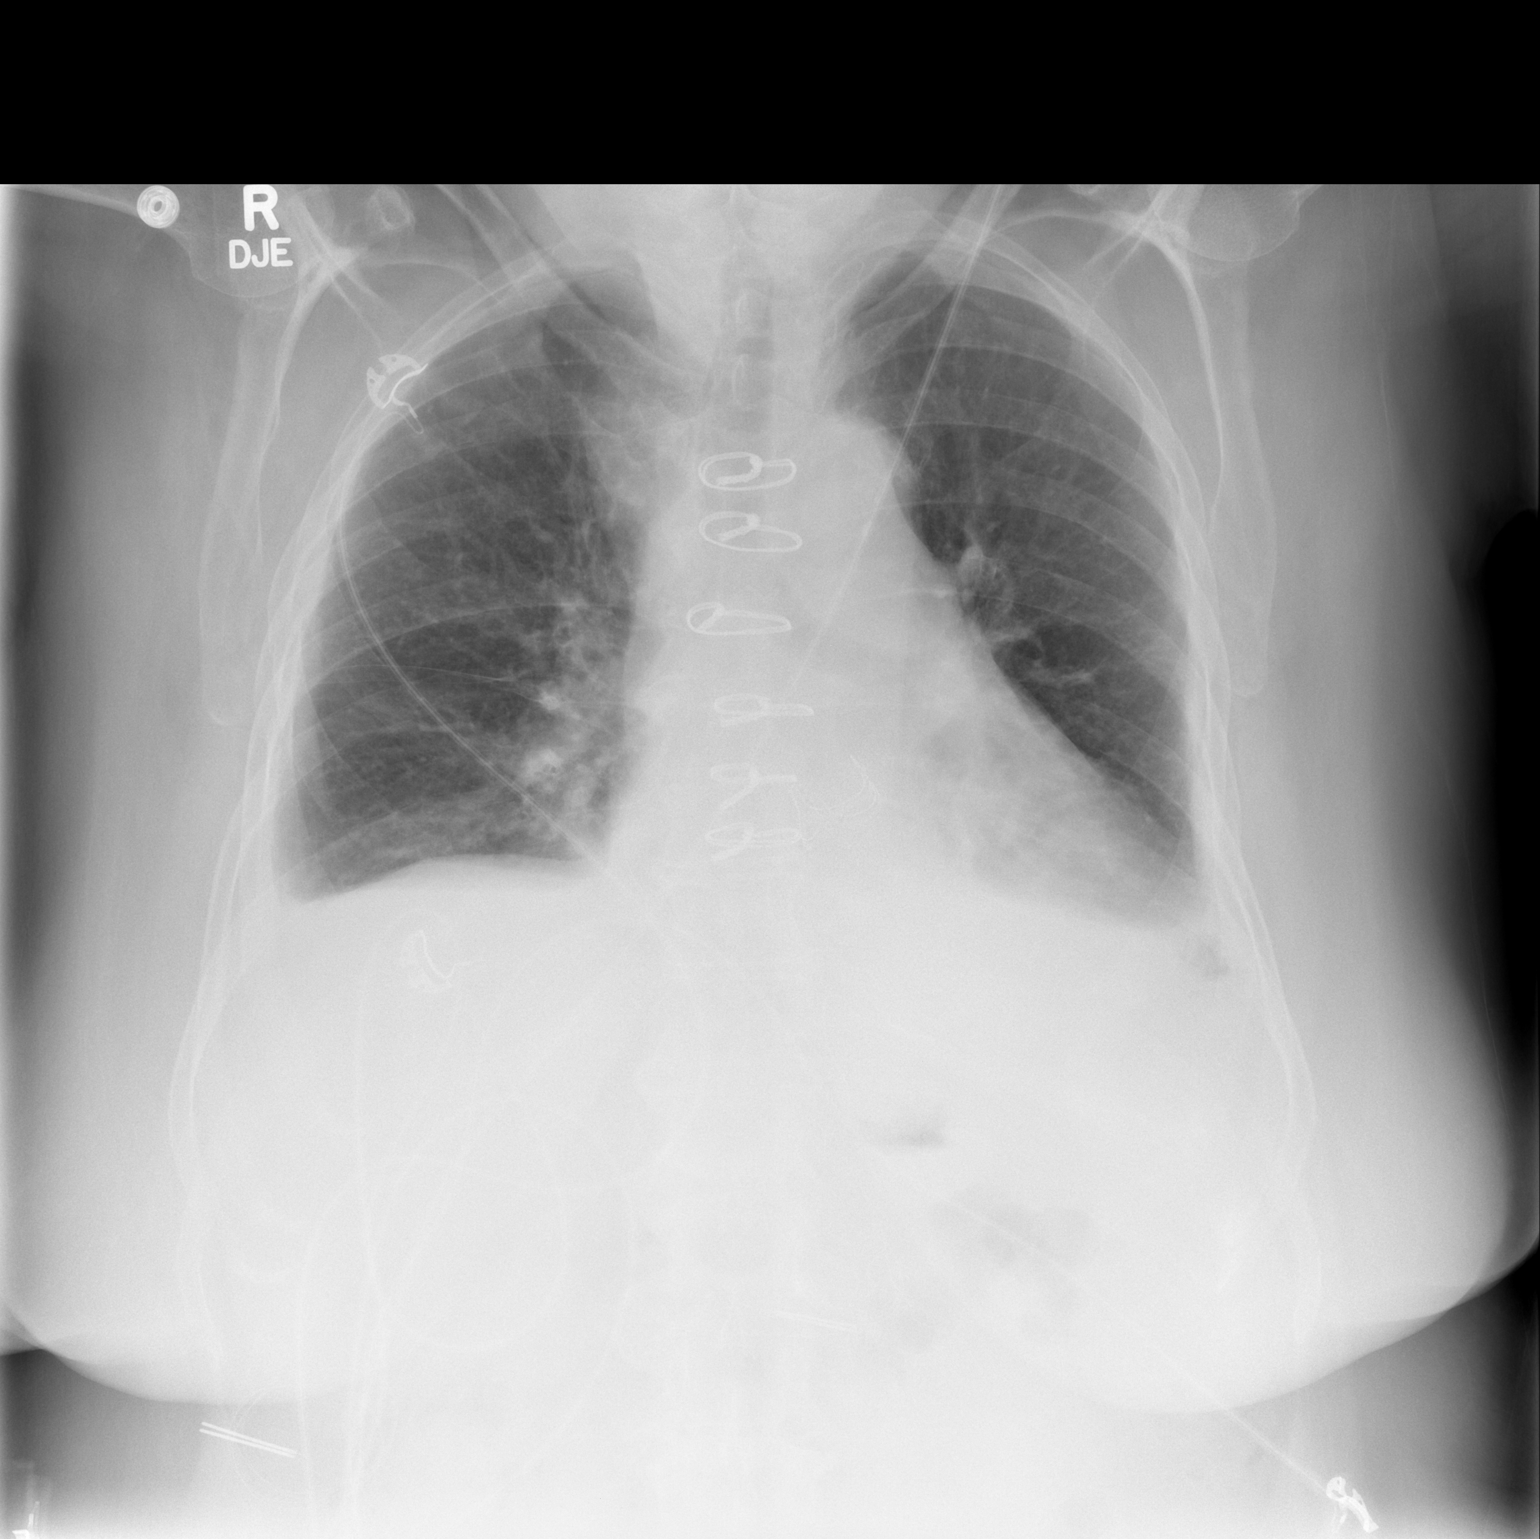

[w chest lat]
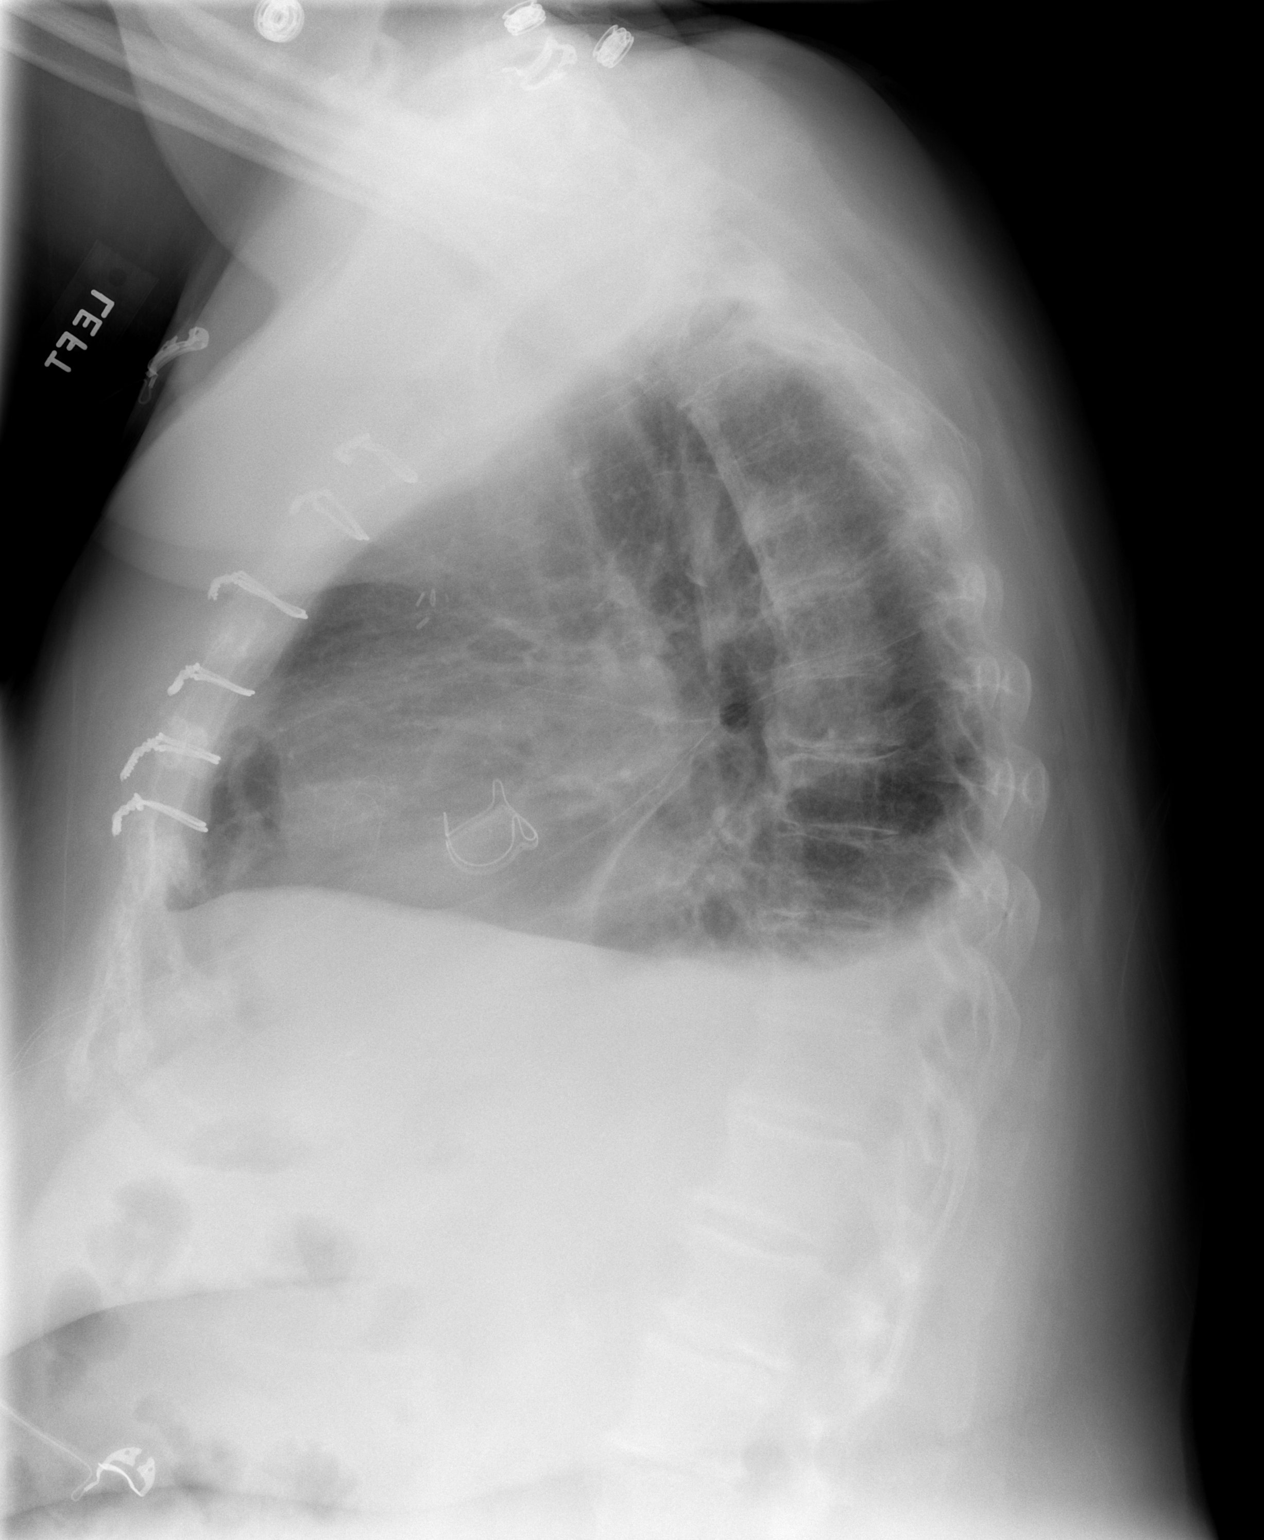

[2 of 2 positions shown; findings below may reference images not displayed]

FINDINGS: Small bilateral pleural effusions remain with associated
bibasilar atelectasis.  The pleural effusions appear stable to
slightly smaller in size.  Overall lung volumes also have improved
slightly.  The heart size is stable.  No pneumothorax.
IMPRESSION: Stable to slightly decreased small bilateral pleural effusions.
Overall aeration also appears slightly improved bilaterally.

## 2011-12-18 ENCOUNTER — Telehealth: Payer: Self-pay | Admitting: Cardiology

## 2011-12-18 NOTE — Telephone Encounter (Signed)
She wanted to put pt on beta blocker and wanted to run this by Dr. Jens Som

## 2011-12-24 NOTE — Telephone Encounter (Signed)
Given first degree AV block, would not use beta blocker but instead norvasc [facility]

## 2011-12-24 NOTE — Telephone Encounter (Signed)
Will forward for dr crenshaw review  

## 2011-12-24 NOTE — Telephone Encounter (Signed)
Spoke with aaron, aware of dr Ludwig Clarks recommendations.

## 2011-12-24 NOTE — Telephone Encounter (Signed)
Follow up from previous call:  Per [name], can patient be put on beta blocker due to blood pressure. Last reading on 146/78.

## 2012-06-05 IMAGING — CR DG CHEST 2V
2 series · 2 of 2 positions shown · non-contrast
Comparison: 12/17/2010

CLINICAL DATA: Chest pain and short of breath

CHEST - 2 VIEW

[w chest pa]
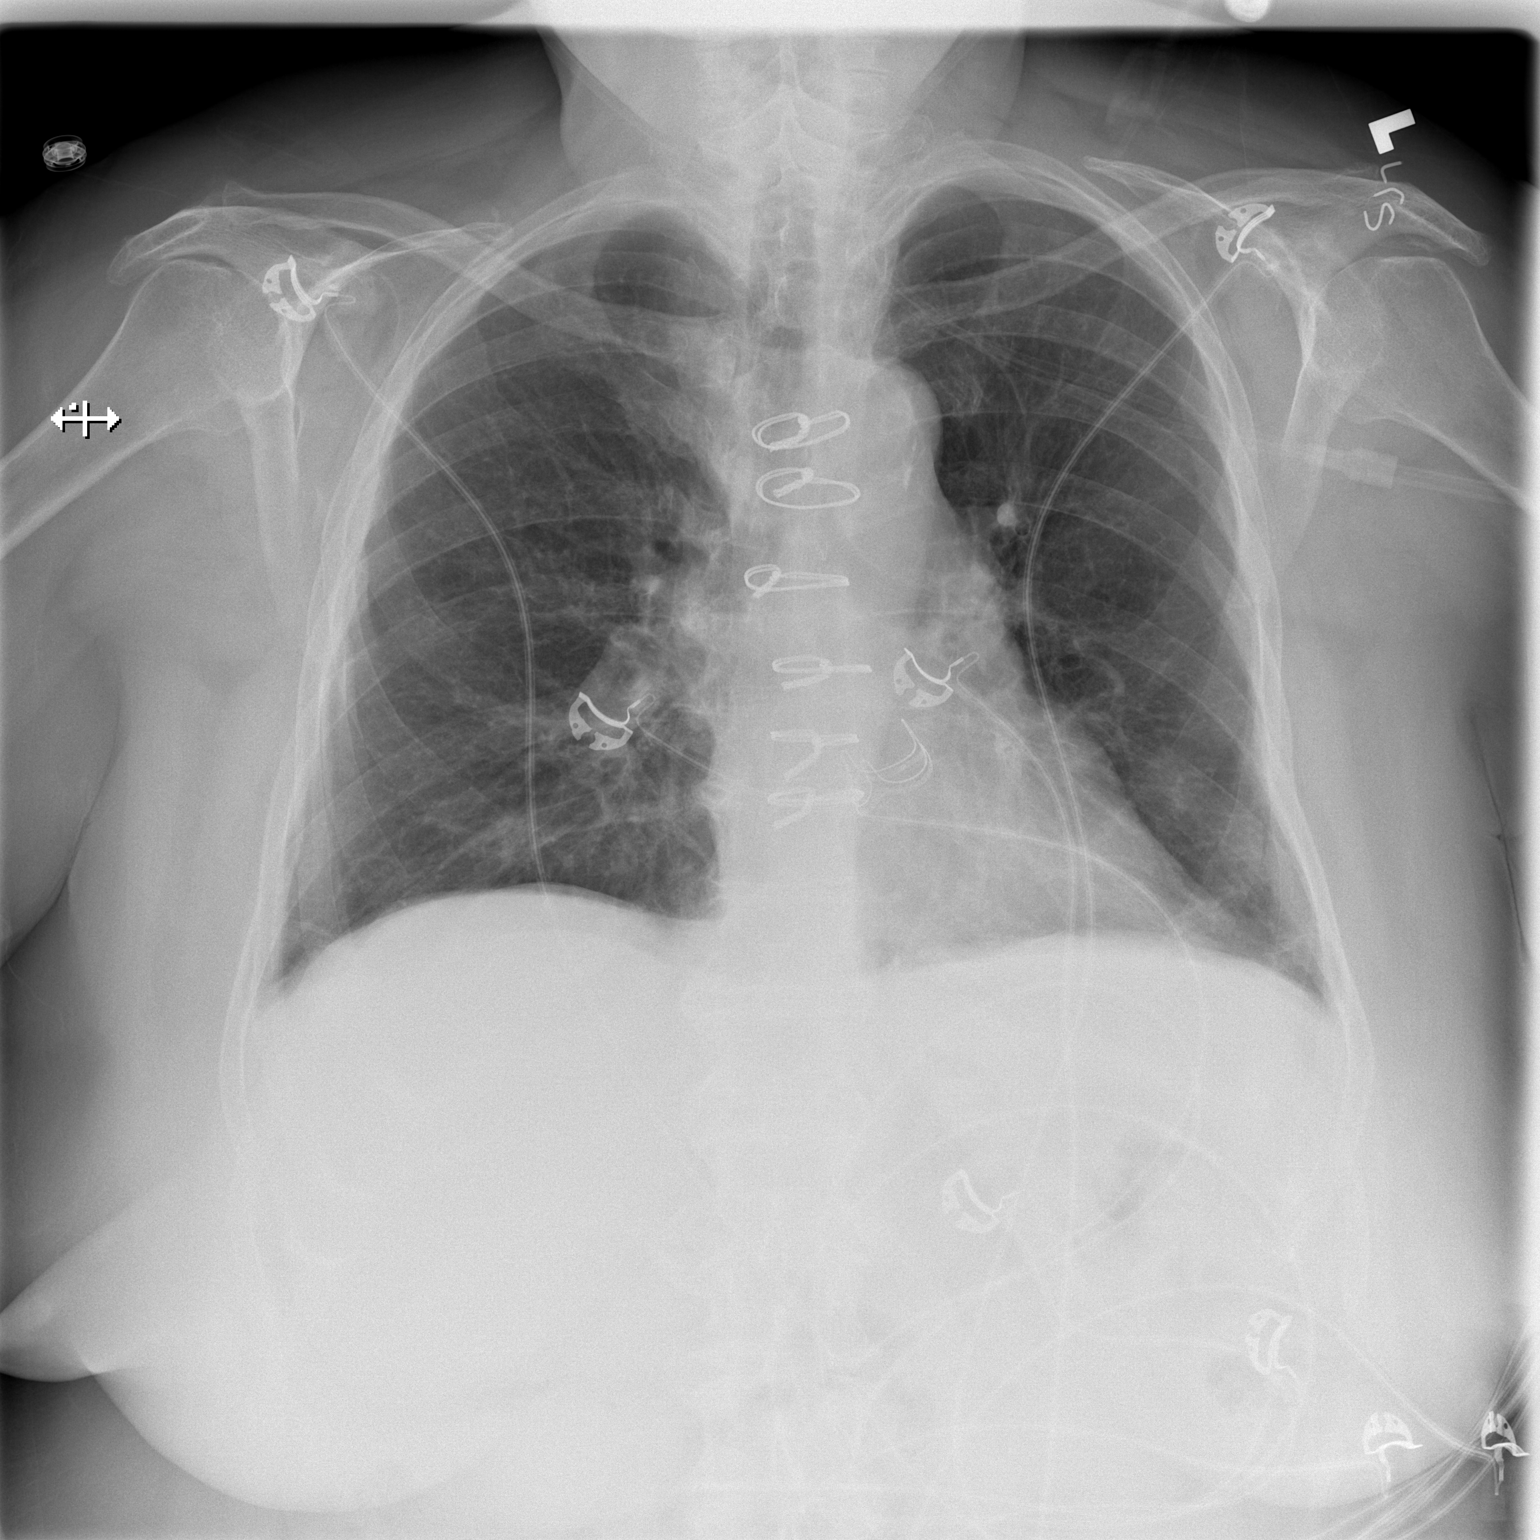

[w chest lat]
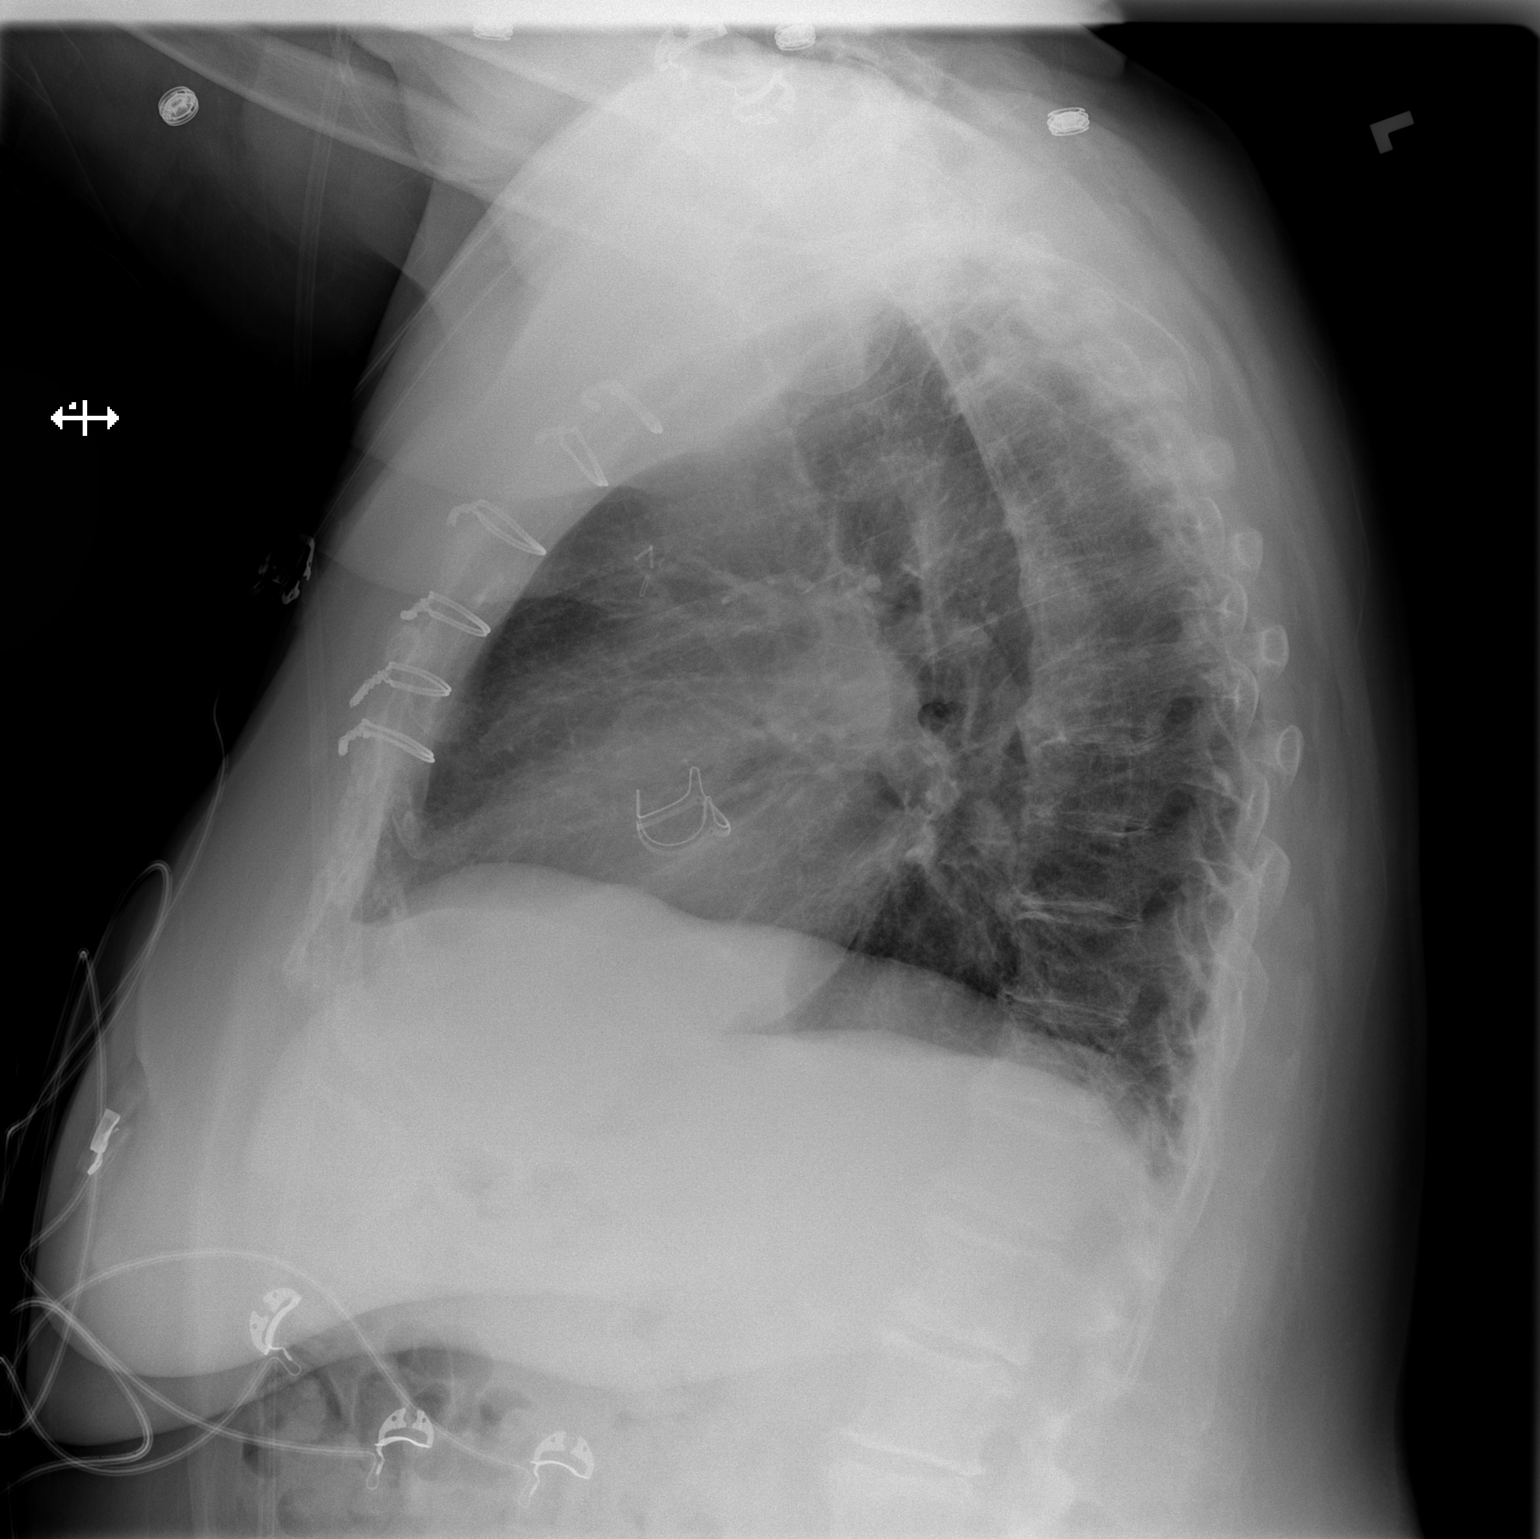

[2 of 2 positions shown; findings below may reference images not displayed]

FINDINGS: Stable postoperative changes in the mediastinum.  Normal
heart size and pulmonary vascularity.  Calcification of the aorta.
Fibrosis in the lung bases.  Degenerative changes in the thoracic
spine.  No focal airspace consolidation in the lung.  Interval
resolution of pleural effusions since previous study.  No blunting
of costophrenic angles today.
IMPRESSION: No evidence of active pulmonary disease.

## 2012-07-09 ENCOUNTER — Emergency Department (HOSPITAL_COMMUNITY): Payer: Medicare Other

## 2012-07-09 ENCOUNTER — Emergency Department (HOSPITAL_COMMUNITY)
Admission: EM | Admit: 2012-07-09 | Discharge: 2012-07-10 | Disposition: A | Discharge status: Home Health | Payer: Medicare Other | Attending: Emergency Medicine | Admitting: Emergency Medicine

## 2012-07-09 ENCOUNTER — Encounter (HOSPITAL_COMMUNITY): Payer: Self-pay | Admitting: *Deleted

## 2012-07-09 DIAGNOSIS — S20219A Contusion of unspecified front wall of thorax, initial encounter: Secondary | ICD-10-CM | POA: Insufficient documentation

## 2012-07-09 DIAGNOSIS — W010XXA Fall on same level from slipping, tripping and stumbling without subsequent striking against object, initial encounter: Secondary | ICD-10-CM | POA: Insufficient documentation

## 2012-07-09 DIAGNOSIS — Y93E1 Activity, personal bathing and showering: Secondary | ICD-10-CM | POA: Insufficient documentation

## 2012-07-09 DIAGNOSIS — F3289 Other specified depressive episodes: Secondary | ICD-10-CM | POA: Insufficient documentation

## 2012-07-09 DIAGNOSIS — Z809 Family history of malignant neoplasm, unspecified: Secondary | ICD-10-CM | POA: Insufficient documentation

## 2012-07-09 DIAGNOSIS — Z833 Family history of diabetes mellitus: Secondary | ICD-10-CM | POA: Insufficient documentation

## 2012-07-09 DIAGNOSIS — Z880 Allergy status to penicillin: Secondary | ICD-10-CM | POA: Insufficient documentation

## 2012-07-09 DIAGNOSIS — Z87891 Personal history of nicotine dependence: Secondary | ICD-10-CM | POA: Insufficient documentation

## 2012-07-09 DIAGNOSIS — F329 Major depressive disorder, single episode, unspecified: Secondary | ICD-10-CM | POA: Insufficient documentation

## 2012-07-09 MED ORDER — HYDROCODONE-ACETAMINOPHEN 5-325 MG PO TABS
1.0000 | ORAL_TABLET | Freq: Once | ORAL | Status: AC
  Administered 2012-07-10: 1 via ORAL
  Filled 2012-07-09: qty 1

## 2012-07-09 NOTE — ED Provider Notes (Signed)
History     CSN: [id number]  Arrival date & time 07/09/12  1940   First MD Initiated Contact with Patient 07/09/12 2326      Chief Complaint  Patient presents with  . Fall  . Flank Pain    (Consider location/radiation/quality/duration/timing/severity/associated sxs/prior treatment) HPI Hx per PT. Fell last night in the shower.  Hurt her L ribs. No head or neck injury. No LOC, inc pain today. Sharp in quality, taking ibuprofen with some relief. No SOB. No pain otherwise. No ext injury. No ABD pain. No radiation of moderate pain, hurts to palpate and to take a deep breath Past Medical History  Diagnosis Date  . Vitamin d deficiency   . OA (osteoarthritis)   . Morbid obesity   . Fracture of rib of left side   . Aortic stenosis   . Dyspnea     DYSPNEA WITH EXERTION  . Constipation     OCCASIONAL CONSTIPATION  . Depression     Past Surgical History  Procedure Date  . Cataract surgery   . Tonsillectomy   . Ovary removed secondary to cust   . 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 Tonsillectomy     as child  . Joint replacement     L TOTAL KNEE 05/2011  . Dental surgery     TEETH EXTRACTED  . Total knee arthroplasty 08/24/2011    Procedure: TOTAL KNEE ARTHROPLASTY;  Surgeon: Shelda Pal;  Location: [facility];  Service: Orthopedics;  Laterality: Right;  with Femoral Nerve Block    Family History  Problem Relation Age of Onset  . Cancer    . Diabetes      History  Substance Use Topics  . Smoking status: Former Smoker -- 25 years    Quit date: 10/20/1979  . Smokeless tobacco: Not on file  . Alcohol Use: No    OB History    Grav Para Term Preterm Abortions TAB SAB Ect Mult Living                  Review of Systems  Constitutional: Negative for fever and chills.  HENT: Negative for neck pain and neck stiffness.   Eyes: Negative for pain.  Respiratory: Negative for shortness of breath.   Cardiovascular: Positive for chest pain.  Gastrointestinal:  Negative for abdominal pain.  Genitourinary: Negative for dysuria.  Musculoskeletal: Negative for back pain.  Skin: Negative for rash.  Neurological: Negative for headaches.  All other systems reviewed and are negative.    Allergies  Penicillins  Home Medications   Current Outpatient Rx  Name Route Sig Dispense Refill  . FERROUS SULFATE 325 (65 FE) MG PO TABS Oral Take 1 tablet (325 mg total) by mouth 3 (three) times daily after meals.    Marland Kitchen RIVAROXABAN 10 MG PO TABS Oral Take 1 tablet (10 mg total) by mouth daily. 12 tablet 0    BP 139/51  Pulse 68  Temp 97.9 F (36.6 C) (Oral)  Resp 18  Ht 5' (1.524 m)  Wt 189 lb (85.73 kg)  BMI 36.91 kg/m2  SpO2 95%  Physical Exam  Constitutional: She is oriented to person, place, and time. She appears well-developed and well-nourished.  HENT:  Head: Normocephalic and atraumatic.  Eyes: Conjunctivae normal and EOM are normal. Pupils are equal, round, and reactive to light.  Neck: Trachea normal. Neck supple. No thyromegaly present.  Cardiovascular: Normal rate, regular rhythm, S1 normal, S2 normal and normal pulses.  No systolic murmur is present   No diastolic murmur is present  Pulses:      Radial pulses are 2+ on the right side, and 2+ on the left side.  Pulmonary/Chest: Effort normal and breath sounds normal. She has no wheezes. She has no rhonchi. She has no rales.       TTP L lateral ribs no crepitus and equal lung sounds, no associated LUQ or ABD tenderness.   Abdominal: Soft. Normal appearance and bowel sounds are normal. There is no tenderness. There is no rebound, no guarding, no CVA tenderness and negative Murphy's sign.  Musculoskeletal:       BLE:s Calves nontender, no cords or erythema, negative Homans sign  Neurological: She is alert and oriented to person, place, and time. She has normal strength. No cranial nerve deficit or sensory deficit. GCS eye subscore is 4. GCS verbal subscore is 5. GCS motor subscore is 6.    Skin: Skin is warm and dry. No rash noted. She is not diaphoretic.  Psychiatric: Her speech is normal.       Cooperative and appropriate    ED Course  Procedures (including critical care time)  Dg Chest 2 View  07/10/2012  *RADIOLOGY REPORT*  Clinical Data: Status post fall. Pain  CHEST - 2 VIEW  Comparison: 06/24/2011  Findings: Previous median sternotomy and aortic valve replacement. Heart size is normal.  No pleural effusion or edema.  No airspace consolidation.  There is scarring noted within both lung bases. The bony thorax appears intact.  IMPRESSION:  1.  No acute cardiopulmonary abnormalities.   Original Report Authenticated By: Rosealee Albee, M.D.     CXR and vicodin  12:25 AM recheck pain improving - feels comfortbale for d/c home with family.   Plan d/c home with occult rib Fx and rib contusion precautions verbalized as understood.   MDM   VS and nursing notes reviewed  PO Narcotics.   Imaging reviewed as above        Sunnie Nielsen, MD 07/10/12 [phone]

## 2012-07-09 NOTE — ED Notes (Signed)
Pt was in shower yesterday and slipped and fell,  Hitting her left side ,  No LOC,  Pain in left flank area,  Pain has been increasing getting worse and some SOB,  Pt broke two ribs on this side last year when she fell

## 2012-07-10 MED ORDER — HYDROCODONE-ACETAMINOPHEN 5-500 MG PO TABS: 1.0000 | ORAL_TABLET | Freq: Four times a day (QID) | ORAL | Status: DC | PRN

## 2012-07-15 ENCOUNTER — Emergency Department (HOSPITAL_COMMUNITY): Payer: Medicare Other

## 2012-07-15 ENCOUNTER — Encounter (HOSPITAL_COMMUNITY): Payer: Self-pay | Admitting: Emergency Medicine

## 2012-07-15 ENCOUNTER — Inpatient Hospital Stay (HOSPITAL_COMMUNITY)
Admission: EM | Admit: 2012-07-15 | Discharge: 2012-07-19 | DRG: 481 | Disposition: A | Discharge status: Skilled Nursing Facility | Payer: Medicare Other | Attending: Internal Medicine | Admitting: Internal Medicine

## 2012-07-15 DIAGNOSIS — Z9079 Acquired absence of other genital organ(s): Secondary | ICD-10-CM

## 2012-07-15 DIAGNOSIS — I359 Nonrheumatic aortic valve disorder, unspecified: Secondary | ICD-10-CM | POA: Diagnosis present

## 2012-07-15 DIAGNOSIS — F3289 Other specified depressive episodes: Secondary | ICD-10-CM | POA: Diagnosis present

## 2012-07-15 DIAGNOSIS — W19XXXA Unspecified fall, initial encounter: Secondary | ICD-10-CM

## 2012-07-15 DIAGNOSIS — Z96659 Presence of unspecified artificial knee joint: Secondary | ICD-10-CM

## 2012-07-15 DIAGNOSIS — S72143A Displaced intertrochanteric fracture of unspecified femur, initial encounter for closed fracture: Principal | ICD-10-CM

## 2012-07-15 DIAGNOSIS — I10 Essential (primary) hypertension: Secondary | ICD-10-CM | POA: Diagnosis present

## 2012-07-15 DIAGNOSIS — R0989 Other specified symptoms and signs involving the circulatory and respiratory systems: Secondary | ICD-10-CM | POA: Diagnosis present

## 2012-07-15 DIAGNOSIS — W108XXA Fall (on) (from) other stairs and steps, initial encounter: Secondary | ICD-10-CM | POA: Diagnosis present

## 2012-07-15 DIAGNOSIS — Z880 Allergy status to penicillin: Secondary | ICD-10-CM

## 2012-07-15 DIAGNOSIS — R509 Fever, unspecified: Secondary | ICD-10-CM | POA: Diagnosis not present

## 2012-07-15 DIAGNOSIS — M1990 Unspecified osteoarthritis, unspecified site: Secondary | ICD-10-CM | POA: Diagnosis present

## 2012-07-15 DIAGNOSIS — R0609 Other forms of dyspnea: Secondary | ICD-10-CM | POA: Diagnosis present

## 2012-07-15 DIAGNOSIS — Y92009 Unspecified place in unspecified non-institutional (private) residence as the place of occurrence of the external cause: Secondary | ICD-10-CM

## 2012-07-15 DIAGNOSIS — Z954 Presence of other heart-valve replacement: Secondary | ICD-10-CM

## 2012-07-15 DIAGNOSIS — E559 Vitamin D deficiency, unspecified: Secondary | ICD-10-CM | POA: Diagnosis present

## 2012-07-15 DIAGNOSIS — Z79899 Other long term (current) drug therapy: Secondary | ICD-10-CM

## 2012-07-15 DIAGNOSIS — Z9089 Acquired absence of other organs: Secondary | ICD-10-CM

## 2012-07-15 DIAGNOSIS — M62838 Other muscle spasm: Secondary | ICD-10-CM | POA: Diagnosis present

## 2012-07-15 DIAGNOSIS — D62 Acute posthemorrhagic anemia: Secondary | ICD-10-CM | POA: Diagnosis not present

## 2012-07-15 DIAGNOSIS — S72009A Fracture of unspecified part of neck of unspecified femur, initial encounter for closed fracture: Secondary | ICD-10-CM

## 2012-07-15 DIAGNOSIS — K219 Gastro-esophageal reflux disease without esophagitis: Secondary | ICD-10-CM | POA: Diagnosis present

## 2012-07-15 DIAGNOSIS — Z6836 Body mass index (BMI) 36.0-36.9, adult: Secondary | ICD-10-CM

## 2012-07-15 DIAGNOSIS — F329 Major depressive disorder, single episode, unspecified: Secondary | ICD-10-CM | POA: Diagnosis present

## 2012-07-15 DIAGNOSIS — Z87891 Personal history of nicotine dependence: Secondary | ICD-10-CM

## 2012-07-15 DIAGNOSIS — K5900 Constipation, unspecified: Secondary | ICD-10-CM | POA: Diagnosis present

## 2012-07-15 LAB — COMPREHENSIVE METABOLIC PANEL
ALT: 17 U/L (ref 0–35)
Alkaline Phosphatase: 75 U/L (ref 39–117)
CO2: 24 mEq/L (ref 19–32)
Chloride: 98 mEq/L (ref 96–112)
GFR calc Af Amer: >90 mL/min (ref >90)
GFR calc non Af Amer: 83 mL/min — ABNORMAL LOW (ref >90)
Glucose, Bld: 149 mg/dL — ABNORMAL HIGH (ref 70–99)
Potassium: 4.6 mEq/L (ref 3.5–5.1)
Sodium: 132 mEq/L — ABNORMAL LOW (ref 135–145)

## 2012-07-15 LAB — PRO B NATRIURETIC PEPTIDE: Pro B Natriuretic peptide (BNP): 575.6 pg/mL — ABNORMAL HIGH (ref 0–125)

## 2012-07-15 MED ORDER — ACETAMINOPHEN 325 MG PO TABS: 650.0000 mg | ORAL_TABLET | Freq: Four times a day (QID) | ORAL | Status: DC | PRN

## 2012-07-15 MED ORDER — ALUM & MAG HYDROXIDE-SIMETH 200-200-20 MG/5ML PO SUSP: 30.0000 mL | Freq: Four times a day (QID) | ORAL | Status: DC | PRN

## 2012-07-15 MED ORDER — HYDROMORPHONE HCL PF 1 MG/ML IJ SOLN
1.0000 mg | Freq: Once | INTRAMUSCULAR | Status: AC
  Administered 2012-07-15: 1 mg via INTRAVENOUS
  Filled 2012-07-15: qty 1

## 2012-07-15 MED ORDER — SERTRALINE HCL 50 MG PO TABS
50.0000 mg | ORAL_TABLET | Freq: Every day | ORAL | Status: DC
  Administered 2012-07-16 – 2012-07-19 (×4): 50 mg via ORAL
  Filled 2012-07-15 (×4): qty 1

## 2012-07-15 MED ORDER — POTASSIUM CHLORIDE IN NACL 20-0.9 MEQ/L-% IV SOLN
INTRAVENOUS | Status: DC
  Administered 2012-07-15: via INTRAVENOUS
  Administered 2012-07-16: 1000 mL via INTRAVENOUS
  Filled 2012-07-15 (×3): qty 1000

## 2012-07-15 MED ORDER — HYDROCODONE-ACETAMINOPHEN 5-325 MG PO TABS
1.0000 | ORAL_TABLET | ORAL | Status: DC | PRN
  Administered 2012-07-17 – 2012-07-19 (×6): 1 via ORAL
  Filled 2012-07-15 (×6): qty 1

## 2012-07-15 MED ORDER — ONDANSETRON HCL 4 MG/2ML IJ SOLN
4.0000 mg | Freq: Once | INTRAMUSCULAR | Status: AC
  Administered 2012-07-15: 4 mg via INTRAVENOUS
  Filled 2012-07-15: qty 2

## 2012-07-15 MED ORDER — MORPHINE SULFATE 2 MG/ML IJ SOLN
2.0000 mg | INTRAMUSCULAR | Status: DC | PRN
  Administered 2012-07-16: 2 mg via INTRAVENOUS
  Filled 2012-07-15 (×2): qty 1

## 2012-07-15 MED ORDER — HYDROMORPHONE HCL PF 1 MG/ML IJ SOLN
0.5000 mg | Freq: Once | INTRAMUSCULAR | Status: AC
  Administered 2012-07-15: 0.5 mg via INTRAVENOUS
  Filled 2012-07-15: qty 1

## 2012-07-15 MED ORDER — ENOXAPARIN SODIUM 40 MG/0.4ML ~~LOC~~ SOLN
40.0000 mg | Freq: Every day | SUBCUTANEOUS | Status: DC
  Filled 2012-07-15: qty 0.4

## 2012-07-15 MED ORDER — ONDANSETRON HCL 4 MG PO TABS: 4.0000 mg | ORAL_TABLET | Freq: Four times a day (QID) | ORAL | Status: DC | PRN

## 2012-07-15 MED ORDER — ONDANSETRON HCL 4 MG/2ML IJ SOLN: 4.0000 mg | Freq: Four times a day (QID) | INTRAMUSCULAR | Status: DC | PRN

## 2012-07-15 MED ORDER — SENNOSIDES-DOCUSATE SODIUM 8.6-50 MG PO TABS
1.0000 | ORAL_TABLET | Freq: Every evening | ORAL | Status: DC | PRN
  Filled 2012-07-15: qty 1

## 2012-07-15 MED ORDER — FENTANYL CITRATE 0.05 MG/ML IJ SOLN
50.0000 ug | Freq: Once | INTRAMUSCULAR | Status: AC
  Administered 2012-07-15: 50 ug via INTRAVENOUS
  Filled 2012-07-15: qty 2

## 2012-07-15 MED ORDER — ACETAMINOPHEN 650 MG RE SUPP: 650.0000 mg | Freq: Four times a day (QID) | RECTAL | Status: DC | PRN

## 2012-07-15 MED ORDER — AMLODIPINE BESYLATE 5 MG PO TABS
5.0000 mg | ORAL_TABLET | Freq: Every day | ORAL | Status: DC
  Administered 2012-07-17 – 2012-07-19 (×3): 5 mg via ORAL
  Filled 2012-07-15 (×4): qty 1

## 2012-07-15 NOTE — ED Notes (Signed)
Blood already in lab.  Notified lab to run BNP

## 2012-07-15 NOTE — ED Notes (Signed)
[phone]  Home [phone]  Cell [place] - Daughter Astrid Divine   - Sister in law

## 2012-07-15 NOTE — ED Notes (Signed)
Per EMS, pt with multiple falls recently. Per EMS pt fell while family assisting pt into home. Pt fell onto buttocks and c/o immediate pain to L hip and thigh.

## 2012-07-15 NOTE — H&P (Signed)
PCP:   Windy Carina, PA-C   Chief Complaint:  Fall  HPI: This is a 77 year old female who has apparently been falling frequently. She had a knee replacement in November of 2012 but apparently since then has been reluctant to ambulate. She uses a walker and has been having increasing episodes of falls. Today she was climbing the stairs and had a mechanical fall. She was unable to get up. 911 was called she was brought to the ER. The patient say she did not hit her head, she had no loss of consciousness. Patient is a minimal historian. She does not give any history of cardiac disease, history of congestive heart failure or chest pains. Family not at bedside.  Review of Systems:  The patient denies anorexia, fever, weight loss,, vision loss, decreased hearing, hoarseness, chest pain, syncope, dyspnea on exertion, peripheral edema, balance deficits, hemoptysis, abdominal pain, melena, hematochezia, severe indigestion/heartburn, hematuria, incontinence, genital sores, muscle weakness, suspicious skin lesions, transient blindness, difficulty walking, depression, unusual weight change, abnormal bleeding, enlarged lymph nodes, angioedema, and breast masses.  Past Medical History: Past Medical History  Diagnosis Date  . Vitamin d deficiency   . OA (osteoarthritis)   . Morbid obesity   . Fracture of rib of left side   . Aortic stenosis   . Dyspnea     DYSPNEA WITH EXERTION  . Constipation     OCCASIONAL CONSTIPATION  . Depression    Past Surgical History  Procedure Date  . Cataract surgery   . Tonsillectomy   . Ovary removed secondary to cust   . 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 Tonsillectomy     as child  . Joint replacement     L TOTAL KNEE 05/2011  . Dental surgery     TEETH EXTRACTED  . Total knee arthroplasty 08/24/2011    Procedure: TOTAL KNEE ARTHROPLASTY;  Surgeon: Shelda Pal;  Location: [facility];  Service: Orthopedics;  Laterality: Right;  with Femoral Nerve  Block    Medications: Prior to Admission medications   Medication Sig Start Date End Date Taking? Authorizing Provider  amLODipine (NORVASC) 5 MG tablet Take 5 mg by mouth daily.   Yes Historical Provider, MD  cetirizine (ZYRTEC) 10 MG tablet Take 10 mg by mouth daily.   Yes Historical Provider, MD  HYDROcodone-acetaminophen (VICODIN) 5-500 MG per tablet Take 1 tablet by mouth every 6 (six) hours as needed. 07/10/12  Yes Sunnie Nielsen, MD  nystatin cream (MYCOSTATIN) Apply topically 2 (two) times daily.   Yes Historical Provider, MD  sertraline (ZOLOFT) 50 MG tablet Take 50 mg by mouth daily.   Yes Historical Provider, MD    Allergies:   Allergies  Allergen Reactions  . Penicillins Hives    Social History:  reports that she quit smoking about 32 years ago. She does not have any smokeless tobacco history on file. She reports that she uses illicit drugs. She reports that she does not drink alcohol.  Family History: Family History  Problem Relation Age of Onset  . Cancer    . Diabetes      Physical Exam: Filed Vitals:   07/15/12 1748 07/15/12 1805  BP: 140/61 140/61  Pulse: 81 78  Temp: 98.6 F (37 C) 97.5 F (36.4 C)  TempSrc: Oral Oral  Resp: 18 16  SpO2: 100% 95%    General:  Alert and oriented times three, well developed and nourished, no acute distress Eyes: PERRLA, pink conjunctiva, no scleral icterus ENT:  Moist oral mucosa, neck supple, no thyromegaly Lungs: clear to ascultation, no wheeze, no crackles, no use of accessory muscles Cardiovascular: regular rate and rhythm, no regurgitation, no gallops, no murmurs. No carotid bruits, no JVD Abdomen: soft, positive BS, non-tender, non-distended, no organomegaly, not an acute abdomen GU: not examined Neuro: CN II - XII grossly intact, sensation intact Musculoskeletal: strength 5/5 all extremities, left lower extremity strength not assessed, no clubbing, cyanosis or edema Skin: no rash, no subcutaneous crepitation, no  decubitus   Labs on Admission:   [facility] 07/15/12 1825  NA 132*  K 4.6  CL 98  CO2 24  GLUCOSE 149*  BUN 19  CREATININE 0.71  CALCIUM 8.6  MG --  PHOS --    Basename 07/15/12 1825  AST 33  ALT 17  ALKPHOS 75  BILITOT 0.3  PROT 7.4  ALBUMIN 3.3*   No results found for this basename: LIPASE:2,AMYLASE:2 in the last 72 hours No results found for this basename: WBC:2,NEUTROABS:2,HGB:2,HCT:2,MCV:2,PLT:2 in the last 72 hours No results found for this basename: CKTOTAL:3,CKMB:3,CKMBINDEX:3,TROPONINI:3 in the last 72 hours No components found with this basename: POCBNP:3 No results found for this basename: DDIMER:2 in the last 72 hours No results found for this basename: HGBA1C:2 in the last 72 hours No results found for this basename: CHOL:2,HDL:2,LDLCALC:2,TRIG:2,CHOLHDL:2,LDLDIRECT:2 in the last 72 hours No results found for this basename: TSH,T4TOTAL,FREET3,T3FREE,THYROIDAB in the last 72 hours No results found for this basename: VITAMINB12:2,FOLATE:2,FERRITIN:2,TIBC:2,IRON:2,RETICCTPCT:2 in the last 72 hours  Micro Results: No results found for this or any previous visit (from the past 240 hour(s)).   Radiological Exams on Admission: Dg Hip Complete Left  07/15/2012  *RADIOLOGY REPORT*  Clinical Data: Left hip pain  LEFT HIP - COMPLETE 2+ VIEW  Comparison: None.  Findings: There is an acute intertrochanteric fracture of the left femur with varus angulation and mild override.  The hip is located.  Right hip is grossly normal.  IMPRESSION: Acute intertrochanteric fracture of the left femur.   Original Report Authenticated By: Genevive Bi, M.D.    Dg Chest Portable 1 View  07/15/2012  *RADIOLOGY REPORT*  Clinical Data: Status post fall.  Shortness of breath.  PORTABLE CHEST - 1 VIEW  Comparison: 07/10/2012.  Findings: There may be mild rightward tracheal deviation, which can be seen with left thyroid enlargement.  Heart size stable. Thoracic aorta is calcified.  Lungs are  low in volume with mild diffuse interstitial prominence.  Minimal left basilar airspace disease.  Old right rib fracture.  IMPRESSION:  1.  Mild diffuse interstitial prominence may be due to low lung volumes.  Pulmonary edema could also have this appearance. 2.  Minimal left basilar airspace disease, possibly due to atelectasis.   Original Report Authenticated By: Reyes Ivan, M.D.     EKG: Normal sinus rhythm  Assessment/Plan Present on Admission:  .Intertrochanteric fracture Admit to MedSurg Orthopedics called,  [facility] Dr Charlann Boxer aware Based on history provided, patient appears to be low to moderate risk for surgical intervention. Patient had a otoscopic a knee intervention in November 2012, she tolerated procedure well .Morbid obesity   DVT prophylaxis    Jared Whorley 07/15/2012, 10:06 PM

## 2012-07-15 NOTE — ED Notes (Signed)
Pt denies hitting head, denies pain to anywhere but L hip/thigh.

## 2012-07-15 NOTE — ED Notes (Signed)
Patient transported to X-ray 

## 2012-07-15 NOTE — ED Notes (Signed)
[id number]<AV> Expected date:<BR> Expected time:<BR> Means of arrival:<BR> Comments:<BR> hold

## 2012-07-15 NOTE — ED Provider Notes (Signed)
History     CSN: [id number]  Arrival date & time 07/15/12  1736   First MD Initiated Contact with Patient 07/15/12 1820      Chief Complaint  Patient presents with  . Fall    (Consider location/radiation/quality/duration/timing/severity/associated sxs/prior treatment) HPI [name] is a 77 y.o. female who presents with multiple falls this week and one visit to the ER a few days ago for a fall complaining chiefly of left hip pain and she is unable to bear weight on that leg. He should pain when moving or pressing on the left hip is 8-10/10 it is sharp, nonradiating. Patient's fall was mechanical, but there is no dizziness, no lightheadedness, she denies any shortness of breath or chest pain. She did not hit her head and she denies LOC. Family members are concerned that the patient has deconditioned after physical therapy has stopped for her bilateral knee replacements.   Past Medical History  Diagnosis Date  . Vitamin d deficiency   . OA (osteoarthritis)   . Morbid obesity   . Fracture of rib of left side   . Aortic stenosis   . Dyspnea     DYSPNEA WITH EXERTION  . Constipation     OCCASIONAL CONSTIPATION  . Depression     Past Surgical History  Procedure Date  . Cataract surgery   . Tonsillectomy   . Ovary removed secondary to cust   . 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 Tonsillectomy     as child  . Joint replacement     L TOTAL KNEE 05/2011  . Dental surgery     TEETH EXTRACTED  . Total knee arthroplasty 08/24/2011    Procedure: TOTAL KNEE ARTHROPLASTY;  Surgeon: Shelda Pal;  Location: [facility];  Service: Orthopedics;  Laterality: Right;  with Femoral Nerve Block    Family History  Problem Relation Age of Onset  . Cancer    . Diabetes      History  Substance Use Topics  . Smoking status: Former Smoker -- 25 years    Quit date: 10/20/1979  . Smokeless tobacco: Not on file  . Alcohol Use: No    OB History    Grav Para Term Preterm  Abortions TAB SAB Ect Mult Living                  Review of SystemsAt least 10pt or greater review of systems completed and are negative except where specified in the HPI.   Allergies  Penicillins  Home Medications   Current Outpatient Rx  Name Route Sig Dispense Refill  . AMLODIPINE BESYLATE 5 MG PO TABS Oral Take 5 mg by mouth daily.    Marland Kitchen CETIRIZINE HCL 10 MG PO TABS Oral Take 10 mg by mouth daily.    Marland Kitchen HYDROCODONE-ACETAMINOPHEN 5-500 MG PO TABS Oral Take 1 tablet by mouth every 6 (six) hours as needed.    . NYSTATIN 100000 UNIT/GM EX CREA Topical Apply topically 2 (two) times daily.    . SERTRALINE HCL 50 MG PO TABS Oral Take 50 mg by mouth daily.      BP 140/61  Pulse 78  Temp 97.5 F (36.4 C) (Oral)  Resp 16  SpO2 95%  Physical Exam  Nursing notes reviewed.  Electronic medical record reviewed. VITAL SIGNS:   Filed Vitals:   07/15/12 1748 07/15/12 1805 07/15/12 2322 07/15/12 2330  BP: 140/61 140/61 158/84   Pulse: 81 78 91   Temp: 98.6 F (  37 C) 97.5 F (36.4 C) 98 F (36.7 C)   TempSrc: Oral Oral Oral   Resp: 18 16 16    Height:    5' (1.524 m)  Weight:    188 lb 0.8 oz (85.3 kg)  SpO2: 100% 95% 95%    CONSTITUTIONAL: Awake, oriented, appears non-toxic HENT: Atraumatic, normocephalic, oral mucosa pink and moist, airway patent. Nares patent without drainage. External ears normal. EYES: Conjunctiva clear, EOMI, PERRLA NECK: Trachea midline, non-tender, supple CARDIOVASCULAR: Normal heart rate, Normal rhythm  PULMONARY/CHEST: Clear to auscultation, no rhonchi, wheezes, or rales. Symmetrical breath sounds. Non-tender. ABDOMINAL: Non-distended, obese, soft, non-tender - no rebound or guarding.  BS normal. NEUROLOGIC: Non-focal, moving all four extremities, no gross sensory or motor deficits. EXTREMITIES: No clubbing, cyanosis, or edema. Tenderness to palpation over the greater trochanter. Left lower extremity is externally rotated and shortened - left leg is  neurovascular intact distally SKIN: Warm, Dry, No erythema, No rash. Ecchymosis to the left lower quadrant and left lateral iliac crest.   ED Course  Procedures (including critical care time)  Date: 07/16/2012  Rate: 104  Rhythm: normal sinus rhythm  QRS Axis: normal  Intervals: PR interval 216 ms  ST/T Wave abnormalities: normal  Conduction Disutrbances: none  Narrative Interpretation: unremarkable - nonischemic EKG, sinus tachycardia     Labs Reviewed  COMPREHENSIVE METABOLIC PANEL - Abnormal; Notable for the following:    Sodium 132 (*)     Glucose, Bld 149 (*)     Albumin 3.3 (*)     GFR calc non Af Amer 83 (*)     All other components within normal limits   Dg Hip Complete Left  07/15/2012  *RADIOLOGY REPORT*  Clinical Data: Left hip pain  LEFT HIP - COMPLETE 2+ VIEW  Comparison: None.  Findings: There is an acute intertrochanteric fracture of the left femur with varus angulation and mild override.  The hip is located.  Right hip is grossly normal.  IMPRESSION: Acute intertrochanteric fracture of the left femur.   Original Report Authenticated By: Genevive Bi, M.D.    Dg Chest Portable 1 View  07/15/2012  *RADIOLOGY REPORT*  Clinical Data: Status post fall.  Shortness of breath.  PORTABLE CHEST - 1 VIEW  Comparison: 07/10/2012.  Findings: There may be mild rightward tracheal deviation, which can be seen with left thyroid enlargement.  Heart size stable. Thoracic aorta is calcified.  Lungs are low in volume with mild diffuse interstitial prominence.  Minimal left basilar airspace disease.  Old right rib fracture.  IMPRESSION:  1.  Mild diffuse interstitial prominence may be due to low lung volumes.  Pulmonary edema could also have this appearance. 2.  Minimal left basilar airspace disease, possibly due to atelectasis.   Original Report Authenticated By: Reyes Ivan, M.D.      1. Hip fracture   2. Fall   3. Intertrochanteric fracture       MDM  [name] is  a 77 y.o. female with a history of bilateral knee replacements, and some deconditioning since discontinuing physical therapy a while ago. Patient has had about 5 falls in the last week and 1 ED visit seen for some bruising of the right hip and lower left quadrant. Patient fell directly backwards today on the left hip and back, no head trauma, no LOC. Patient is complaining chiefly about left hip pain. Overall presentation is consistent with a hip fracture  - Will obtain x-rays and basic labs.   X-rays consistent  with a left intertrochanteric fracture. Chest x-ray shows some potential mild pulmonary edema versus low lung volumes with some minimal left basilar airspace disease - questions to be atelectasis by radiology I do not think the patient has pneumonia and she does not sound volume overloaded.  Other labs are fairly unremarkable, patient does have a slightly low sodium of 132.    Discussed with [facility] hospitalist for admission.  D/w Dr. Charlann Boxer 07/15/2012 10:23 PM Orthopedics - he'll see the patient tomorrow        Jones Skene, MD 07/16/12 1191

## 2012-07-16 ENCOUNTER — Encounter (HOSPITAL_COMMUNITY): Payer: Self-pay | Admitting: Anesthesiology

## 2012-07-16 ENCOUNTER — Encounter (HOSPITAL_COMMUNITY)
Admission: EM | Disposition: A | Discharge status: Skilled Nursing Facility | Payer: Self-pay | Source: Home / Self Care | Attending: Internal Medicine

## 2012-07-16 ENCOUNTER — Inpatient Hospital Stay (HOSPITAL_COMMUNITY): Payer: Medicare Other

## 2012-07-16 ENCOUNTER — Inpatient Hospital Stay (HOSPITAL_COMMUNITY): Payer: Medicare Other | Admitting: Anesthesiology

## 2012-07-16 HISTORY — PX: FEMUR IM NAIL: SHX1597

## 2012-07-16 LAB — APTT: aPTT: 29 seconds (ref 24–37)

## 2012-07-16 LAB — CBC
HCT: 40.7 % (ref 36.0–46.0)
MCHC: 33.7 g/dL (ref 30.0–36.0)
MCHC: 33.6 g/dL (ref 30.0–36.0)
MCV: 94 fL (ref 78.0–100.0)
RDW: 13.8 % (ref 11.5–15.5)
RDW: 13.5 % (ref 11.5–15.5)

## 2012-07-16 LAB — BASIC METABOLIC PANEL
BUN: 18 mg/dL (ref 6–23)
Creatinine, Ser: 0.69 mg/dL (ref 0.50–1.10)
GFR calc Af Amer: >90 mL/min (ref >90)
GFR calc non Af Amer: 84 mL/min — ABNORMAL LOW (ref >90)

## 2012-07-16 LAB — MRSA PCR SCREENING: MRSA by PCR: POSITIVE — AB

## 2012-07-16 SURGERY — INSERTION, INTRAMEDULLARY ROD, FEMUR
Anesthesia: General | Site: Hip | Laterality: Left | Wound class: Clean

## 2012-07-16 MED ORDER — LIDOCAINE HCL (CARDIAC) 20 MG/ML IV SOLN
INTRAVENOUS | Status: DC | PRN
  Administered 2012-07-16: 70 mg via INTRAVENOUS

## 2012-07-16 MED ORDER — ENOXAPARIN SODIUM 40 MG/0.4ML ~~LOC~~ SOLN
40.0000 mg | Freq: Every day | SUBCUTANEOUS | Status: DC
  Filled 2012-07-16: qty 0.4

## 2012-07-16 MED ORDER — GLYCOPYRROLATE 0.2 MG/ML IJ SOLN
INTRAMUSCULAR | Status: DC | PRN
  Administered 2012-07-16: 0.6 mg via INTRAVENOUS

## 2012-07-16 MED ORDER — SUCCINYLCHOLINE CHLORIDE 20 MG/ML IJ SOLN
INTRAMUSCULAR | Status: DC | PRN
  Administered 2012-07-16: 100 mg via INTRAVENOUS

## 2012-07-16 MED ORDER — HYDROMORPHONE HCL PF 1 MG/ML IJ SOLN: 0.2500 mg | INTRAMUSCULAR | Status: DC | PRN

## 2012-07-16 MED ORDER — METHOCARBAMOL 500 MG PO TABS
500.0000 mg | ORAL_TABLET | Freq: Four times a day (QID) | ORAL | Status: DC | PRN
  Administered 2012-07-17 – 2012-07-19 (×3): 500 mg via ORAL
  Filled 2012-07-16 (×3): qty 1

## 2012-07-16 MED ORDER — ONDANSETRON HCL 4 MG/2ML IJ SOLN: 4.0000 mg | Freq: Four times a day (QID) | INTRAMUSCULAR | Status: DC | PRN

## 2012-07-16 MED ORDER — MORPHINE SULFATE 2 MG/ML IJ SOLN
0.5000 mg | INTRAMUSCULAR | Status: DC | PRN
  Administered 2012-07-16: 0.5 mg via INTRAVENOUS
  Filled 2012-07-16: qty 1

## 2012-07-16 MED ORDER — MENTHOL 3 MG MT LOZG: 1.0000 | LOZENGE | OROMUCOSAL | Status: DC | PRN

## 2012-07-16 MED ORDER — METHOCARBAMOL 100 MG/ML IJ SOLN
500.0000 mg | Freq: Four times a day (QID) | INTRAVENOUS | Status: DC | PRN
  Administered 2012-07-16: 500 mg via INTRAVENOUS
  Filled 2012-07-16: qty 5

## 2012-07-16 MED ORDER — PROMETHAZINE HCL 25 MG/ML IJ SOLN: 6.2500 mg | INTRAMUSCULAR | Status: DC | PRN

## 2012-07-16 MED ORDER — ENOXAPARIN SODIUM 40 MG/0.4ML ~~LOC~~ SOLN: 40.0000 mg | SUBCUTANEOUS | Status: DC

## 2012-07-16 MED ORDER — PROPOFOL 10 MG/ML IV BOLUS
INTRAVENOUS | Status: DC | PRN
  Administered 2012-07-16: 100 mg via INTRAVENOUS

## 2012-07-16 MED ORDER — DOCUSATE SODIUM 100 MG PO CAPS
100.0000 mg | ORAL_CAPSULE | Freq: Two times a day (BID) | ORAL | Status: DC
  Administered 2012-07-17 – 2012-07-19 (×5): 100 mg via ORAL

## 2012-07-16 MED ORDER — MORPHINE SULFATE 2 MG/ML IJ SOLN
2.0000 mg | INTRAMUSCULAR | Status: DC | PRN
  Administered 2012-07-16: 2 mg via INTRAVENOUS
  Filled 2012-07-16: qty 1

## 2012-07-16 MED ORDER — ACETAMINOPHEN 325 MG PO TABS
650.0000 mg | ORAL_TABLET | Freq: Four times a day (QID) | ORAL | Status: DC | PRN
  Administered 2012-07-17: 650 mg via ORAL
  Filled 2012-07-16: qty 2

## 2012-07-16 MED ORDER — ROCURONIUM BROMIDE 100 MG/10ML IV SOLN
INTRAVENOUS | Status: DC | PRN
  Administered 2012-07-16: 30 mg via INTRAVENOUS

## 2012-07-16 MED ORDER — HYDROMORPHONE HCL PF 1 MG/ML IJ SOLN
1.0000 mg | INTRAMUSCULAR | Status: DC | PRN
  Administered 2012-07-16: 2 mg via INTRAVENOUS
  Administered 2012-07-17: 1 mg via INTRAVENOUS
  Filled 2012-07-16: qty 1

## 2012-07-16 MED ORDER — FLEET ENEMA 7-19 GM/118ML RE ENEM: 1.0000 | ENEMA | Freq: Once | RECTAL | Status: AC | PRN

## 2012-07-16 MED ORDER — EPHEDRINE SULFATE 50 MG/ML IJ SOLN
INTRAMUSCULAR | Status: DC | PRN
  Administered 2012-07-16 (×2): 10 mg via INTRAVENOUS

## 2012-07-16 MED ORDER — ONDANSETRON HCL 4 MG PO TABS: 4.0000 mg | ORAL_TABLET | Freq: Four times a day (QID) | ORAL | Status: DC | PRN

## 2012-07-16 MED ORDER — LORATADINE 10 MG PO TABS
10.0000 mg | ORAL_TABLET | Freq: Every day | ORAL | Status: DC
  Administered 2012-07-16 – 2012-07-19 (×4): 10 mg via ORAL
  Filled 2012-07-16 (×4): qty 1

## 2012-07-16 MED ORDER — METOCLOPRAMIDE HCL 5 MG/ML IJ SOLN: 5.0000 mg | Freq: Three times a day (TID) | INTRAMUSCULAR | Status: DC | PRN

## 2012-07-16 MED ORDER — HYDROCODONE-ACETAMINOPHEN 5-325 MG PO TABS: 1.0000 | ORAL_TABLET | Freq: Four times a day (QID) | ORAL | Status: DC | PRN

## 2012-07-16 MED ORDER — LACTATED RINGERS IV SOLN
INTRAVENOUS | Status: DC | PRN
  Administered 2012-07-16 (×2): via INTRAVENOUS

## 2012-07-16 MED ORDER — HYDROMORPHONE HCL PF 1 MG/ML IJ SOLN
1.0000 mg | INTRAMUSCULAR | Status: DC | PRN
  Administered 2012-07-16 (×2): 1 mg via INTRAVENOUS
  Filled 2012-07-16 (×2): qty 1

## 2012-07-16 MED ORDER — ENOXAPARIN SODIUM 40 MG/0.4ML ~~LOC~~ SOLN
40.0000 mg | Freq: Every day | SUBCUTANEOUS | Status: DC
  Administered 2012-07-17 – 2012-07-19 (×3): 40 mg via SUBCUTANEOUS
  Filled 2012-07-16 (×3): qty 0.4

## 2012-07-16 MED ORDER — ACETAMINOPHEN 650 MG RE SUPP: 650.0000 mg | Freq: Four times a day (QID) | RECTAL | Status: DC | PRN

## 2012-07-16 MED ORDER — POLYETHYLENE GLYCOL 3350 17 G PO PACK
17.0000 g | PACK | Freq: Every day | ORAL | Status: DC
  Administered 2012-07-17 – 2012-07-19 (×3): 17 g via ORAL

## 2012-07-16 MED ORDER — ONDANSETRON HCL 4 MG/2ML IJ SOLN
INTRAMUSCULAR | Status: DC | PRN
  Administered 2012-07-16: 4 mg via INTRAVENOUS

## 2012-07-16 MED ORDER — METOCLOPRAMIDE HCL 10 MG PO TABS
5.0000 mg | ORAL_TABLET | Freq: Three times a day (TID) | ORAL | Status: DC | PRN
Start: 2012-07-16 — End: 2012-07-19

## 2012-07-16 MED ORDER — FENTANYL CITRATE 0.05 MG/ML IJ SOLN
INTRAMUSCULAR | Status: DC | PRN
  Administered 2012-07-16 (×2): 25 ug via INTRAVENOUS

## 2012-07-16 MED ORDER — CEFAZOLIN SODIUM-DEXTROSE 2-3 GM-% IV SOLR
INTRAVENOUS | Status: DC | PRN
  Administered 2012-07-16: 2 g via INTRAVENOUS

## 2012-07-16 MED ORDER — SODIUM CHLORIDE 0.9 % IV SOLN
INTRAVENOUS | Status: DC
  Administered 2012-07-16 – 2012-07-18 (×2): via INTRAVENOUS
  Filled 2012-07-16 (×5): qty 1000

## 2012-07-16 MED ORDER — HYDROMORPHONE HCL PF 1 MG/ML IJ SOLN
0.5000 mg | INTRAMUSCULAR | Status: DC | PRN
  Filled 2012-07-16: qty 2

## 2012-07-16 MED ORDER — NEOSTIGMINE METHYLSULFATE 1 MG/ML IJ SOLN
INTRAMUSCULAR | Status: DC | PRN
  Administered 2012-07-16: 4 mg via INTRAVENOUS

## 2012-07-16 MED ORDER — PHENOL 1.4 % MT LIQD: 1.0000 | OROMUCOSAL | Status: DC | PRN

## 2012-07-16 SURGICAL SUPPLY — 42 items
BAG ZIPLOCK 12X15 (MISCELLANEOUS) ×3 IMPLANT
BANDAGE GAUZE ELAST BULKY 4 IN (GAUZE/BANDAGES/DRESSINGS) ×3 IMPLANT
BIT DRILL CANN LG 4.3MM (BIT) ×4 IMPLANT
CLOTH BEACON ORANGE TIMEOUT ST (SAFETY) ×3 IMPLANT
DRAPE INCISE IOBAN 66X45 STRL (DRAPES) ×3 IMPLANT
DRAPE STERI IOBAN 125X83 (DRAPES) ×3 IMPLANT
DRILL BIT CANN LG 4.3MM (BIT) ×6
DRSG AQUACEL AG ADV 3.5X 4 (GAUZE/BANDAGES/DRESSINGS) ×6 IMPLANT
DRSG AQUACEL AG ADV 3.5X 6 (GAUZE/BANDAGES/DRESSINGS) ×3 IMPLANT
DRSG MEPILEX BORDER 4X4 (GAUZE/BANDAGES/DRESSINGS) ×3 IMPLANT
DRSG MEPILEX BORDER 4X8 (GAUZE/BANDAGES/DRESSINGS) ×3 IMPLANT
DRSG PAD ABDOMINAL 8X10 ST (GAUZE/BANDAGES/DRESSINGS) ×6 IMPLANT
DURAPREP 26ML APPLICATOR (WOUND CARE) ×3 IMPLANT
ELECT REM PT RETURN 9FT ADLT (ELECTROSURGICAL) ×3
ELECTRODE REM PT RTRN 9FT ADLT (ELECTROSURGICAL) ×2 IMPLANT
GLOVE BIOGEL PI IND STRL 7.5 (GLOVE) ×2 IMPLANT
GLOVE BIOGEL PI IND STRL 8 (GLOVE) ×2 IMPLANT
GLOVE BIOGEL PI INDICATOR 7.5 (GLOVE) ×1
GLOVE BIOGEL PI INDICATOR 8 (GLOVE) ×1
GLOVE ECLIPSE 8.0 STRL XLNG CF (GLOVE) IMPLANT
GLOVE ORTHO TXT STRL SZ7.5 (GLOVE) ×6 IMPLANT
GLOVE SURG ORTHO 8.0 STRL STRW (GLOVE) ×3 IMPLANT
GOWN BRE IMP PREV XXLGXLNG (GOWN DISPOSABLE) ×6 IMPLANT
GOWN STRL NON-REIN LRG LVL3 (GOWN DISPOSABLE) ×3 IMPLANT
GUIDEPIN 3.2X17.5 THRD DISP (PIN) ×6 IMPLANT
GUIDEWIRE BALL NOSE 80CM (WIRE) ×6 IMPLANT
HFN LAG SCREW 10.5MM X 115MM (Orthopedic Implant) ×3 IMPLANT
KIT BASIN OR (CUSTOM PROCEDURE TRAY) ×3 IMPLANT
MANIFOLD NEPTUNE II (INSTRUMENTS) ×3 IMPLANT
NAIL HIP FRACT 130D 11X180 (Screw) ×3 IMPLANT
PACK GENERAL/GYN (CUSTOM PROCEDURE TRAY) ×3 IMPLANT
POSITIONER SURGICAL ARM (MISCELLANEOUS) ×3 IMPLANT
SCREW BONE CORTICAL 5.0X36 (Screw) ×3 IMPLANT
SPONGE GAUZE 4X4 12PLY (GAUZE/BANDAGES/DRESSINGS) ×3 IMPLANT
STAPLER VISISTAT 35W (STAPLE) ×3 IMPLANT
SUT VIC AB 0 CT1 27 (SUTURE) ×2
SUT VIC AB 0 CT1 27XBRD ANTBC (SUTURE) ×4 IMPLANT
SUT VIC AB 1 CT1 27 (SUTURE) ×2
SUT VIC AB 1 CT1 27XBRD ANTBC (SUTURE) ×4 IMPLANT
SUT VIC AB 2-0 CT1 27 (SUTURE) ×2
SUT VIC AB 2-0 CT1 27XBRD (SUTURE) ×4 IMPLANT
TOWEL OR 17X26 10 PK STRL BLUE (TOWEL DISPOSABLE) ×6 IMPLANT

## 2012-07-16 NOTE — Anesthesia Preprocedure Evaluation (Addendum)
Anesthesia Evaluation  Patient identified by MRN, date of birth, ID band Patient awake    Reviewed: Allergy & Precautions, H&P , NPO status , Patient's Chart, lab work & pertinent test results  Airway Mallampati: II TM Distance: >3 FB Neck ROM: Full    Dental No notable dental hx.    Pulmonary shortness of breath, former smoker,  breath sounds clear to auscultation  Pulmonary exam normal       Cardiovascular hypertension, Pt. on medications + Valvular Problems/Murmurs AS Rhythm:Regular Rate:Normal  Aortic stenosis, s/p aortic valve replacement 2012   Neuro/Psych PSYCHIATRIC DISORDERS Depression negative neurological ROS     GI/Hepatic Neg liver ROS, GERD-  ,  Endo/Other  negative endocrine ROS  Renal/GU negative Renal ROS  negative genitourinary   Musculoskeletal negative musculoskeletal ROS (+)   Abdominal (+) + obese,   Peds negative pediatric ROS (+)  Hematology negative hematology ROS (+)   Anesthesia Other Findings   Reproductive/Obstetrics negative OB ROS                          Anesthesia Physical Anesthesia Plan  ASA: III  Anesthesia Plan: General   Post-op Pain Management:    Induction: Intravenous  Airway Management Planned: Oral ETT  Additional Equipment:   Intra-op Plan:   Post-operative Plan: Extubation in OR  Informed Consent: I have reviewed the patients History and Physical, chart, labs and discussed the procedure including the risks, benefits and alternatives for the proposed anesthesia with the patient or authorized representative who has indicated his/her understanding and acceptance.   Dental advisory given  Plan Discussed with: CRNA  Anesthesia Plan Comments:         Anesthesia Quick Evaluation

## 2012-07-16 NOTE — Progress Notes (Signed)
[facility] PROGRESS NOTE  [name] RUE:[id number] DOB: 1935/08/20 DOA: 07/15/2012 PCP: Windy Carina, PA-C   Brief narrative: 36 female with OA, hx of AS, recent knee replacement with fall at home with left femur intertrochanteric fracture.  Assessment/Plan:  Intertrochanteric fracture of femur. seen by ortho and s/p IM nailing done today stable post op continue pain control with low dose prn morphine and robaxin for muscle spasms Will order PT DVT prophylaxis with sq lovenox  HTN  cont Norvasc    Code Status: full Family Communication: Daughter at bedside Disposition Plan: pending PT eval. Will need SNF     Consultants:  Dr Charlann Boxer ( ortho)  Procedures:  IM nailing of left femur fracture  Antibiotics:  none  HPI/Subjective: Patient sleepy after returning from OR. Family at bedisde  Objective: Filed Vitals:   07/16/12 1400 07/16/12 1415 07/16/12 1428 07/16/12 1528  BP: 159/64 151/56 153/79 134/76  Pulse: 85 79 81 84  Temp:  98.2 F (36.8 C) 97.4 F (36.3 C) 97.7 F (36.5 C)  TempSrc:   Oral Oral  Resp: 16 12 16 16   Height:      Weight:      SpO2: 100% 100% 96% 98%    Intake/Output Summary (Last 24 hours) at 07/16/12 1614 Last data filed at 07/16/12 1415  Gross per 24 hour  Intake 2776.67 ml  Output    800 ml  Net 1976.67 ml   Filed Weights   07/15/12 2330  Weight: 85.3 kg (188 lb 0.8 oz)    Exam:   General:  Elderly female lying in bed sleepy  Cardiovascular: N S1 &S2, no murmurs  Respiratory: clear b/l, no added sounds  Abdomen: soft, NT, ND, BS+  Ext: warm, dressing over left hip intact, mild bruising and swollen  CNS: sleepy but arousable. Non focal   Data Reviewed: Basic Metabolic Panel:  Lab 07/16/12 1478 07/15/12 1825  NA 135 132*  K 4.6 4.6  CL 101 98  CO2 26 24  GLUCOSE 117* 149*  BUN 18 19  CREATININE 0.69 0.71  CALCIUM 8.3* 8.6  MG -- --  PHOS -- --   Liver Function Tests:  Lab 07/15/12 1825    AST 33  ALT 17  ALKPHOS 75  BILITOT 0.3  PROT 7.4  ALBUMIN 3.3*   No results found for this basename: LIPASE:5,AMYLASE:5 in the last 168 hours No results found for this basename: AMMONIA:5 in the last 168 hours CBC:  Lab 07/16/12 0515 07/15/12 1825  WBC 7.7 8.3  NEUTROABS -- --  HGB 12.1 13.7  HCT 36.0 40.7  MCV 94.2 94.0  PLT 180 232   Cardiac Enzymes: No results found for this basename: CKTOTAL:5,CKMB:5,CKMBINDEX:5,TROPONINI:5 in the last 168 hours BNP (last 3 results)  Basename 07/15/12 1825  PROBNP 575.6*   CBG: No results found for this basename: GLUCAP:5 in the last 168 hours  Recent Results (from the past 240 hour(s))  MRSA PCR SCREENING     Status: Abnormal   Collection Time   07/16/12 10:47 AM      Component Value Range Status Comment   MRSA by PCR POSITIVE (*) NEGATIVE Final      Studies: Dg Hip Complete Left  07/15/2012  *RADIOLOGY REPORT*  Clinical Data: Left hip pain  LEFT HIP - COMPLETE 2+ VIEW  Comparison: None.  Findings: There is an acute intertrochanteric fracture of the left femur with varus angulation and mild override.  The hip is located.  Right hip  is grossly normal.  IMPRESSION: Acute intertrochanteric fracture of the left femur.   Original Report Authenticated By: Genevive Bi, M.D.    Dg Femur Left  07/16/2012  *RADIOLOGY REPORT*  Clinical Data: Placement of intermedullary nail across proximal femur.  LEFT FEMUR - 2 VIEW  Comparison: Preoperative films of 1 day prior.  Findings: Placement of an intermedullary rod with proximal screw fixation device across the previously described intertrochanteric femur fracture. No acute hardware complication.  Alignment is improved.  IMPRESSION: Intraoperative imaging of proximal femoral fixation.   Original Report Authenticated By: Consuello Bossier, M.D.    Dg Chest Portable 1 View  07/15/2012  *RADIOLOGY REPORT*  Clinical Data: Status post fall.  Shortness of breath.  PORTABLE CHEST - 1 VIEW  Comparison:  07/10/2012.  Findings: There may be mild rightward tracheal deviation, which can be seen with left thyroid enlargement.  Heart size stable. Thoracic aorta is calcified.  Lungs are low in volume with mild diffuse interstitial prominence.  Minimal left basilar airspace disease.  Old right rib fracture.  IMPRESSION:  1.  Mild diffuse interstitial prominence may be due to low lung volumes.  Pulmonary edema could also have this appearance. 2.  Minimal left basilar airspace disease, possibly due to atelectasis.   Original Report Authenticated By: Reyes Ivan, M.D.     Scheduled Meds:   . amLODipine  5 mg Oral Daily  . docusate sodium  100 mg Oral BID  . enoxaparin (LOVENOX) injection  40 mg Subcutaneous Daily  . fentaNYL  50 mcg Intravenous Once  .  HYDROmorphone (DILAUDID) injection  0.5 mg Intravenous Once  .  HYDROmorphone (DILAUDID) injection  1 mg Intravenous Once  . loratadine  10 mg Oral Daily  . ondansetron (ZOFRAN) IV  4 mg Intravenous Once  . polyethylene glycol  17 g Oral Daily  . sertraline  50 mg Oral Daily  . DISCONTD: enoxaparin (LOVENOX) injection  40 mg Subcutaneous QHS  . DISCONTD: enoxaparin (LOVENOX) injection  40 mg Subcutaneous Daily  . DISCONTD: enoxaparin (LOVENOX) injection  40 mg Subcutaneous Q24H   Continuous Infusions:   . sodium chloride 0.9 % 1,000 mL with potassium chloride 10 mEq infusion    . DISCONTD: 0.9 % NaCl with KCl 20 mEq / L 1,000 mL (07/16/12 0944)       Time spent:25 minutes    [name]  [facility] Pager [phone]. If 8PM-8AM, please contact night-coverage at [email or web address], password [email or web address] 07/16/2012, 4:14 PM  LOS: 1 day

## 2012-07-16 NOTE — Progress Notes (Signed)
Clinical Social Work Department BRIEF PSYCHOSOCIAL ASSESSMENT 07/16/2012  Patient:  [name], ROATH     Account Number:  [id number]     Admit date:  07/15/2012  Clinical Social Worker:  Leron Croak, CLINICAL SOCIAL WORKER  Date/Time:  07/16/2012 05:30 PM  Referred by:  Physician  Date Referred:  07/16/2012 Referred for  SNF Placement   Other Referral:   Interview type:  Family Other interview type:   Daughter and Sister-in-Law  were at the bedside.    PSYCHOSOCIAL DATA Living Status:  WITH ADULT CHILDREN Admitted from facility:   Level of care:   Primary support name:  Harriett Coffey Primary support relationship to patient:  CHILD, ADULT Degree of support available:   Daughter is living with the Pt. Pt also has the support of the sister-in-law    CURRENT CONCERNS Current Concerns  Post-Acute Placement   Other Concerns:   Pt is somewhat weak due to decrease mobility    SOCIAL WORK ASSESSMENT / PLAN CSW met with Pt, dughter (Harriett) and Sisiter-in-law Corrie Dandy) at the bedside. Pt was present, however was eating and did not participate in the discussion. Daughter stated that Pt has had one stay in [facility] ([facility]) and she does not want her to return to that facility. Pt's daughter gives permission for CSW to search in [facility] for SNF placement. There are no particular preferrences at this time.   Assessment/plan status:  Information/Referral to [facility] Other assessment/ plan:   Information/referral to community resources:   CSW provided contact information and [employer] of SNF's.    PATIENT'S/FAMILY'S RESPONSE TO PLAN OF CARE: Pt's family was appreciative for assistance with d/c planning and SNF placement. Daughter will be moving and may not answer the phone right away, however will return any messages left.        Leron Croak, LCSWA [employer] [phone]

## 2012-07-16 NOTE — Brief Op Note (Signed)
07/15/2012 - 07/16/2012  1:31 PM  PATIENT:  [name]  77 y.o. female  PRE-OPERATIVE DIAGNOSIS:  left hip fracture, reverse obliquity, subtrochanteric extension  POST-OPERATIVE DIAGNOSIS:   left hip fracture, reverse obliquity, subtrochanteric extension  PROCEDURE:  Procedure(s) (LRB) with comments: INTRAMEDULLARY (IM) NAIL FEMORAL (Left)  SURGEON:  Surgeon(s) and Role:    * Shelda Pal, MD - Primary  PHYSICIAN ASSISTANT: None  ANESTHESIA:   general  EBL:  Total I/O In: 2088.3 [I.V.:2088.3] Out: 200 [Blood:200]  BLOOD ADMINISTERED:none  DRAINS: none   LOCAL MEDICATIONS USED:  NONE  SPECIMEN:  No Specimen  DISPOSITION OF SPECIMEN:  N/A  COUNTS:  YES  TOURNIQUET:  * No tourniquets in log *  DICTATION: .Other Dictation: Dictation Number [id number]  PLAN OF CARE: Admit to inpatient   PATIENT DISPOSITION:  PACU - hemodynamically stable.   Delay start of Pharmacological VTE agent (>24hrs) due to surgical blood loss or risk of bleeding: no

## 2012-07-16 NOTE — Progress Notes (Signed)
Pt sent to OR via bed. Mont Jagoda, [employer]

## 2012-07-16 NOTE — Anesthesia Postprocedure Evaluation (Signed)
  Anesthesia Post-op Note  Patient: [name]  Procedure(s) Performed: Procedure(s) (LRB): INTRAMEDULLARY (IM) NAIL FEMORAL (Left)  Patient Location: PACU  Anesthesia Type: General  Level of Consciousness: awake and alert   Airway and Oxygen Therapy: Patient Spontanous Breathing  Post-op Pain: mild  Post-op Assessment: Post-op Vital signs reviewed, Patient's Cardiovascular Status Stable, Respiratory Function Stable, Patent Airway and No signs of Nausea or vomiting  Post-op Vital Signs: stable  Complications: No apparent anesthesia complications

## 2012-07-16 NOTE — Preoperative (Signed)
Beta Blockers   Reason not to administer Beta Blockers:Not Applicable 

## 2012-07-16 NOTE — Progress Notes (Signed)
[name] NP notified pt pain unrelieved with Morphine Q2 prn. New order received for Dilaudid which pt has previously received in the ED. [name] 4:44 AM

## 2012-07-16 NOTE — Progress Notes (Signed)
Left intertrochanteric femur fracture  Ready to have hip fixed  Consent to be signed, NPO  To OR today for ORIF of the left hip

## 2012-07-16 NOTE — Transfer of Care (Signed)
Immediate Anesthesia Transfer of Care Note  Patient: [name]  Procedure(s) Performed: Procedure(s) (LRB): INTRAMEDULLARY (IM) NAIL FEMORAL (Left)  Patient Location: PACU  Anesthesia Type: General  Level of Consciousness: sedated, patient cooperative and responds to stimulaton  Airway & Oxygen Therapy: Patient Spontanous Breathing and Patient connected to face mask oxgen  Post-op Assessment: Report given to PACU RN and Post -op Vital signs reviewed and stable  Post vital signs: Reviewed and stable  Complications: No apparent anesthesia complications

## 2012-07-16 NOTE — Consult Note (Signed)
Reason for Consult: LEFT hip intertrochanteric femur fracture Referring Physician:  Dhungel  [name] is an 77 y.o. female.  HPI: 77 yo female patient of ine from previous B [facility].  She had a ground level fall while at home where she lives with her daughter.  No other injuries to report  Past Medical History  Diagnosis Date  . Vitamin d deficiency   . OA (osteoarthritis)   . Morbid obesity   . Fracture of rib of left side   . Aortic stenosis   . Dyspnea     DYSPNEA WITH EXERTION  . Constipation     OCCASIONAL CONSTIPATION  . Depression     Past Surgical History  Procedure Date  . Cataract surgery   . Tonsillectomy   . Ovary removed secondary to cust   . 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 Tonsillectomy     as child  . Joint replacement     L TOTAL KNEE 05/2011  . Dental surgery     TEETH EXTRACTED  . Total knee arthroplasty 08/24/2011    Procedure: TOTAL KNEE ARTHROPLASTY;  Surgeon: Shelda Pal;  Location: [facility]S;  Service: Orthopedics;  Laterality: Right;  with Femoral Nerve Block    Family History  Problem Relation Age of Onset  . Cancer    . Diabetes      Social History:  reports that she quit smoking about 32 years ago. She does not have any smokeless tobacco history on file. She reports that she uses illicit drugs. She reports that she does not drink alcohol.  Lives with family independently  Allergies:  Allergies  Allergen Reactions  . Penicillins Hives    Medications:  I have reviewed the patient's current medications. Scheduled:   . amLODipine  5 mg Oral Daily  . enoxaparin (LOVENOX) injection  40 mg Subcutaneous Daily  . fentaNYL  50 mcg Intravenous Once  .  HYDROmorphone (DILAUDID) injection  0.5 mg Intravenous Once  .  HYDROmorphone (DILAUDID) injection  1 mg Intravenous Once  . ondansetron (ZOFRAN) IV  4 mg Intravenous Once  . sertraline  50 mg Oral Daily  . DISCONTD: enoxaparin (LOVENOX) injection  40 mg  Subcutaneous QHS    Results for orders placed during the hospital encounter of 07/15/12 (from the past 24 hour(s))  COMPREHENSIVE METABOLIC PANEL     Status: Abnormal   Collection Time   07/15/12  6:25 PM      Component Value Range   Sodium 132 (*) 135 - 145 mEq/L   Potassium 4.6  3.5 - 5.1 mEq/L   Chloride 98  96 - 112 mEq/L   CO2 24  19 - 32 mEq/L   Glucose, Bld 149 (*) 70 - 99 mg/dL   BUN 19  6 - 23 mg/dL   Creatinine, Ser 1.61  0.50 - 1.10 mg/dL   Calcium 8.6  8.4 - 09.6 mg/dL   Total Protein 7.4  6.0 - 8.3 g/dL   Albumin 3.3 (*) 3.5 - 5.2 g/dL   AST 33  0 - 37 U/L   ALT 17  0 - 35 U/L   Alkaline Phosphatase 75  39 - 117 U/L   Total Bilirubin 0.3  0.3 - 1.2 mg/dL   GFR calc non Af Amer 83 (*) >90 mL/min   GFR calc Af Amer >90  >90 mL/min  PRO B NATRIURETIC PEPTIDE     Status: Abnormal   Collection Time   07/15/12  6:25  PM      Component Value Range   Pro B Natriuretic peptide (BNP) 575.6 (*) 0 - 125 pg/mL  CBC     Status: Normal   Collection Time   07/15/12  6:25 PM      Component Value Range   WBC 8.3  4.0 - 10.5 K/uL   RBC 4.33  3.87 - 5.11 MIL/uL   Hemoglobin 13.7  12.0 - 15.0 g/dL   HCT 62.1  30.8 - 65.7 %   MCV 94.0  78.0 - 100.0 fL   MCH 31.6  26.0 - 34.0 pg   MCHC 33.7  30.0 - 36.0 g/dL   RDW 84.6  96.2 - 95.2 %   Platelets 232  150 - 400 K/uL  BASIC METABOLIC PANEL     Status: Abnormal   Collection Time   07/16/12  5:15 AM      Component Value Range   Sodium 135  135 - 145 mEq/L   Potassium 4.6  3.5 - 5.1 mEq/L   Chloride 101  96 - 112 mEq/L   CO2 26  19 - 32 mEq/L   Glucose, Bld 117 (*) 70 - 99 mg/dL   BUN 18  6 - 23 mg/dL   Creatinine, Ser 8.41  0.50 - 1.10 mg/dL   Calcium 8.3 (*) 8.4 - 10.5 mg/dL   GFR calc non Af Amer 84 (*) >90 mL/min   GFR calc Af Amer >90  >90 mL/min  CBC     Status: Abnormal   Collection Time   07/16/12  5:15 AM      Component Value Range   WBC 7.7  4.0 - 10.5 K/uL   RBC 3.82 (*) 3.87 - 5.11 MIL/uL   Hemoglobin 12.1  12.0 -  15.0 g/dL   HCT 32.4  40.1 - 02.7 %   MCV 94.2  78.0 - 100.0 fL   MCH 31.7  26.0 - 34.0 pg   MCHC 33.6  30.0 - 36.0 g/dL   RDW 25.3  66.4 - 40.3 %   Platelets 180  150 - 400 K/uL  APTT     Status: Normal   Collection Time   07/16/12  5:15 AM      Component Value Range   aPTT 29  24 - 37 seconds     X-ray: LEFT HIP - COMPLETE 2+ VIEW  Comparison: None.  Findings: There is an acute intertrochanteric fracture of the left  femur with varus angulation and mild override. The hip is  located. Right hip is grossly normal.  IMPRESSION:  Acute intertrochanteric fracture of the left femur.  Original Report Authenticated By: Genevive Bi, M.D.   ROS: Per medical admit  Blood pressure 110/68, pulse 73, temperature 98.4 F (36.9 C), temperature source Oral, resp. rate 16, height 5' (1.524 m), weight 85.3 kg (188 lb 0.8 oz), SpO2 95.00%.  Exam: Awake alert Left hip with bruising Shortened externally rotated NVI Knee stable  Assessment/Plan: Left intertrochanteric femur fracture ORIF of the left hip fracture  Carrie Usery D 07/16/2012, 11:38 AM

## 2012-07-17 ENCOUNTER — Inpatient Hospital Stay (HOSPITAL_COMMUNITY): Payer: Medicare Other

## 2012-07-17 LAB — CBC
HCT: 28.7 % — ABNORMAL LOW (ref 36.0–46.0)
Platelets: 141 10*3/uL — ABNORMAL LOW (ref 150–400)
RDW: 13.5 % (ref 11.5–15.5)
WBC: 7.8 10*3/uL (ref 4.0–10.5)

## 2012-07-17 LAB — BASIC METABOLIC PANEL
Chloride: 99 mEq/L (ref 96–112)
GFR calc Af Amer: >90 mL/min (ref >90)
Potassium: 4.3 mEq/L (ref 3.5–5.1)
Sodium: 132 mEq/L — ABNORMAL LOW (ref 135–145)

## 2012-07-17 LAB — URINALYSIS, ROUTINE W REFLEX MICROSCOPIC
Leukocytes, UA: NEGATIVE
Nitrite: NEGATIVE
Specific Gravity, Urine: 1.025 (ref 1.005–1.030)
pH: 5.5 (ref 5.0–8.0)

## 2012-07-17 NOTE — Progress Notes (Signed)
Subjective: 1 Day Post-Op Procedure(s) (LRB): INTRAMEDULLARY (IM) NAIL FEMORAL (Left) Patient reports pain as mild.    Objective: Vital signs in last 24 hours: Temp:  [97.4 F (36.3 C)-101.8 F (38.8 C)] 99.2 F (37.3 C) (09/29 0530) Pulse Rate:  [73-103] 101  (09/29 0420) Resp:  [12-20] 20  (09/29 0420) BP: (110-182)/(56-79) 117/72 mmHg (09/29 0420) SpO2:  [95 %-100 %] 98 % (09/29 0420)  Intake/Output from previous day: 09/28 0701 - 09/29 0700 In: 2543.3 [P.O.:60; I.V.:2483.3] Out: 825 [Urine:625; Blood:200] Intake/Output this shift:     Basename 07/17/12 0422 07/16/12 0515 07/15/12 1825  HGB 9.5* 12.1 13.7    Basename 07/17/12 0422 07/16/12 0515  WBC 7.8 7.7  RBC 3.03* 3.82*  HCT 28.7* 36.0  PLT 141* 180    Basename 07/17/12 0422 07/16/12 0515  NA 132* 135  K 4.3 4.6  CL 99 101  CO2 27 26  BUN 14 18  CREATININE 0.74 0.69  GLUCOSE 115* 117*  CALCIUM 7.8* 8.3*   No results found for this basename: LABPT:2,INR:2 in the last 72 hours  Intact pulses distally Dressing clean, dry, intact.   Assessment/Plan: 1 Day Post-Op Procedure(s) (LRB): INTRAMEDULLARY (IM) NAIL FEMORAL (Left) Up with therapy  Donyelle Enyeart A 07/17/2012, 9:38 AM

## 2012-07-17 NOTE — Progress Notes (Signed)
[facility] PROGRESS NOTE  AMANNDA KOOB ION:[id number] DOB: [birth year] DOA: 07/15/2012 PCP: Windy Carina, PA-C  Brief narrative:  52 female with OA, hx of AS, recent knee replacement with fall at home with left femur intertrochanteric fracture.   Assessment/Plan:  Intertrochanteric fracture of femur.  seen by ortho and s/p IM nailing done today stable post op continue pain control with low dose prn morphine and robaxin for muscle spasms   PT eval DVT prophylaxis with sq lovenox    Fever T max of 101.8 overnight. UA and cxr unremarkable. Will monitor   HTN  cont Norvasc   Code Status: full  Family Communication: none at bedside. Daughter involved in care  Disposition Plan: pending PT eval. Will need SNF  Consultants:  Dr Charlann Boxer ( ortho) Procedures:  ORIF with IM nailing of left femur fracture on 9/28  Antibiotics:  none HPI/Subjective:  Had temp of 101.8 overnight. Denies any symptoms. Says her pain is stable with pain medications  Objective: Filed Vitals:   07/17/12 0400 07/17/12 0420 07/17/12 0530 07/17/12 1007  BP:  117/72  110/69  Pulse:  101  86  Temp:  101.8 F (38.8 C) 99.2 F (37.3 C) 98.5 F (36.9 C)  TempSrc:  Oral Oral Oral  Resp: 20 20  20   Height:      Weight:      SpO2: 98% 98%  98%    Intake/Output Summary (Last 24 hours) at 07/17/12 1352 Last data filed at 07/17/12 0417  Gross per 24 hour  Intake    455 ml  Output    625 ml  Net   -170 ml   Filed Weights   07/15/12 2330  Weight: 85.3 kg (188 lb 0.8 oz)    Exam:  General: Elderly female lying in bed in NAD Cardiovascular: N S1 &S2, no murmurs  Respiratory: clear b/l, no added sounds  Abdomen: soft, NT, ND, BS+  Ext: warm, dressing over left hip intact,  CNS: sleepy but arousable. Non focal    Data Reviewed: Basic Metabolic Panel:  Lab 07/17/12 2440 07/16/12 0515 07/15/12 1825  NA 132* 135 132*  K 4.3 4.6 4.6  CL 99 101 98  CO2 27 26 24   GLUCOSE 115* 117* 149*  BUN  14 18 19   CREATININE 0.74 0.69 0.71  CALCIUM 7.8* 8.3* 8.6  MG -- -- --  PHOS -- -- --   Liver Function Tests:  Lab 07/15/12 1825  AST 33  ALT 17  ALKPHOS 75  BILITOT 0.3  PROT 7.4  ALBUMIN 3.3*   No results found for this basename: LIPASE:5,AMYLASE:5 in the last 168 hours No results found for this basename: AMMONIA:5 in the last 168 hours CBC:  Lab 07/17/12 0422 07/16/12 0515 07/15/12 1825  WBC 7.8 7.7 8.3  NEUTROABS -- -- --  HGB 9.5* 12.1 13.7  HCT 28.7* 36.0 40.7  MCV 94.7 94.2 94.0  PLT 141* 180 232   Cardiac Enzymes: No results found for this basename: CKTOTAL:5,CKMB:5,CKMBINDEX:5,TROPONINI:5 in the last 168 hours BNP (last 3 results)  Basename 07/15/12 1825  PROBNP 575.6*   CBG: No results found for this basename: GLUCAP:5 in the last 168 hours  Recent Results (from the past 240 hour(s))  MRSA PCR SCREENING     Status: Abnormal   Collection Time   07/16/12 10:47 AM      Component Value Range Status Comment   MRSA by PCR POSITIVE (*) NEGATIVE Final      Studies: Dg  Hip Complete Left  07/15/2012  *RADIOLOGY REPORT*  Clinical Data: Left hip pain  LEFT HIP - COMPLETE 2+ VIEW  Comparison: None.  Findings: There is an acute intertrochanteric fracture of the left femur with varus angulation and mild override.  The hip is located.  Right hip is grossly normal.  IMPRESSION: Acute intertrochanteric fracture of the left femur.   Original Report Authenticated By: Genevive Bi, M.D.    Dg Femur Left  07/16/2012  *RADIOLOGY REPORT*  Clinical Data: Placement of intermedullary nail across proximal femur.  LEFT FEMUR - 2 VIEW  Comparison: Preoperative films of 1 day prior.  Findings: Placement of an intermedullary rod with proximal screw fixation device across the previously described intertrochanteric femur fracture. No acute hardware complication.  Alignment is improved.  IMPRESSION: Intraoperative imaging of proximal femoral fixation.   Original Report Authenticated By:  Consuello Bossier, M.D.    Dg Chest Port 1 View  07/17/2012  *RADIOLOGY REPORT*  Clinical Data: Rule out pneumonia.  No stenosis.  Dyspnea.  PORTABLE CHEST - 1 VIEW  Comparison: 07/15/2012  Findings: Prior median sternotomy. Normal heart size.  No pleural effusion or pneumothorax.  Minimal subsegmental atelectasis at the left lung base.  The right lung is clear.  IMPRESSION: No acute cardiopulmonary disease.   Original Report Authenticated By: Consuello Bossier, M.D.    Dg Chest Portable 1 View  07/15/2012  *RADIOLOGY REPORT*  Clinical Data: Status post fall.  Shortness of breath.  PORTABLE CHEST - 1 VIEW  Comparison: 07/10/2012.  Findings: There may be mild rightward tracheal deviation, which can be seen with left thyroid enlargement.  Heart size stable. Thoracic aorta is calcified.  Lungs are low in volume with mild diffuse interstitial prominence.  Minimal left basilar airspace disease.  Old right rib fracture.  IMPRESSION:  1.  Mild diffuse interstitial prominence may be due to low lung volumes.  Pulmonary edema could also have this appearance. 2.  Minimal left basilar airspace disease, possibly due to atelectasis.   Original Report Authenticated By: Reyes Ivan, M.D.    Dg C-arm 1-60 Min-no Report  07/16/2012  CLINICAL DATA: fractured left hip   C-ARM 1-60 MINUTES  Fluoroscopy was utilized by the requesting physician.  No radiographic  interpretation.      Scheduled Meds:   . amLODipine  5 mg Oral Daily  . docusate sodium  100 mg Oral BID  . enoxaparin (LOVENOX) injection  40 mg Subcutaneous Daily  . loratadine  10 mg Oral Daily  . polyethylene glycol  17 g Oral Daily  . sertraline  50 mg Oral Daily  . DISCONTD: enoxaparin (LOVENOX) injection  40 mg Subcutaneous Daily  . DISCONTD: enoxaparin (LOVENOX) injection  40 mg Subcutaneous Q24H   Continuous Infusions:   . sodium chloride 0.9 % 1,000 mL with potassium chloride 10 mEq infusion 50 mL/hr at 07/16/12 2006  . DISCONTD: 0.9 % NaCl  with KCl 20 mEq / L 1,000 mL (07/16/12 0944)      Time spent: 25 MINUTES    Andalyn Heckstall  [facility] Pager [phone] If 8PM-8AM, please contact night-coverage at [email or web address], password [facility] 07/17/2012, 1:52 PM  LOS: 2 days

## 2012-07-17 NOTE — Evaluation (Signed)
Physical Therapy Evaluation Patient Details Name: [name] MRN: [id number] DOB: 1935-07-14 Today's Date: 07/17/2012 Time: 4098-1191 PT Time Calculation (min): 22 min  PT Assessment / Plan / Recommendation Clinical Impression  77 yo female s/p L femoral IM nail for proximal femur fx. Increased pain with L LE movement/OOB activity. Pt unable to attempt stand pivot or ambulation on eval. Recommend SNF for continued rehab.     PT Assessment  Patient needs continued PT services    Follow Up Recommendations  Skilled nursing facility    Barriers to Discharge        Equipment Recommendations  Wheelchair (measurements);Wheelchair cushion (measurements)    Recommendations for Other Services OT consult   Frequency Min 3X/week    Precautions / Restrictions Precautions Precautions: Fall Restrictions Weight Bearing Restrictions: Yes LLE Weight Bearing: Partial weight bearing LLE Partial Weight Bearing Percentage or Pounds: 50%   Pertinent Vitals/Pain 7/10 L LE with activity; 0/10 at rest      Mobility  Bed Mobility Bed Mobility: Supine to Sit Supine to Sit: 1: +2 Total assist;HOB elevated;With rails Supine to Sit: Patient Percentage: 40% Details for Bed Mobility Assistance: Multimodal cues for safety, technique, hand placement. Assist for bil LEs off bed and trunk to upright. Utilized sheet to assist with scooting, positioning Transfers Transfers: Sit to Stand;Stand to Sit;Squat Pivot Transfers Sit to Stand: 1: +2 Total assist Sit to Stand: Patient Percentage: 40% Stand to Sit: 1: +2 Total assist Stand to Sit: Patient Percentage: 40% Squat Pivot Transfers: 1: +2 Total assist Squat Pivot Transfers: Patient Percentage: 40% Details for Transfer Assistance: Multimodal cues for safety, technique, hand placement. Sit to stand from elevated bed with RW. Pt able to stand but unable to take any step/pivot. Squat-pivot to R side. Assist to rise, support pt in standing/during pivot,    Ambulation/Gait Ambulation/Gait Assistance: Not tested (comment)    Shoulder Instructions     Exercises     PT Diagnosis: Difficulty walking;Acute pain;Generalized weakness  PT Problem List: Decreased strength;Decreased range of motion;Decreased activity tolerance;Decreased mobility;Pain;Decreased knowledge of precautions;Decreased knowledge of use of DME PT Treatment Interventions: DME instruction;Gait training;Functional mobility training;Therapeutic activities;Therapeutic exercise;Patient/family education   PT Goals Acute Rehab PT Goals PT Goal Formulation: With patient Time For Goal Achievement: 07/31/12 Potential to Achieve Goals: Good Pt will go Supine/Side to Sit: with mod assist PT Goal: Supine/Side to Sit - Progress: Goal set today Pt will go Sit to Supine/Side: with mod assist PT Goal: Sit to Supine/Side - Progress: Goal set today Pt will go Sit to Stand: with max assist PT Goal: Sit to Stand - Progress: Goal set today Pt will Transfer Bed to Chair/Chair to Bed: with max assist PT Transfer Goal: Bed to Chair/Chair to Bed - Progress: Goal set today Pt will Ambulate: 1 - 15 feet;with max assist;with rolling walker PT Goal: Ambulate - Progress: Goal set today  Visit Information  Last PT Received On: 07/17/12 Assistance Needed: +2    Subjective Data  Subjective: "It hurts" Patient Stated Goal: None stated   Prior Functioning  Home Living Lives With: Daughter Available Help at Discharge: Family Type of Home: Mobile home Home Access: Stairs to enter Entrance Stairs-Number of Steps: 5-6 Entrance Stairs-Rails: Right;Left Home Layout: One level Home Adaptive Equipment: Walker - rolling;Straight cane Prior Function Level of Independence: Needs assistance Needs Assistance: Meal Prep;Light Housekeeping Meal Prep: Total Light Housekeeping: Total Able to Take Stairs?: Yes Communication Communication: No difficulties    Cognition  Overall Cognitive Status: Appears  within functional limits for tasks assessed/performed Arousal/Alertness: Awake/alert Orientation Level: Appears intact for tasks assessed Behavior During Session: [facility] for tasks performed    Extremity/Trunk Assessment Right Lower Extremity Assessment RLE ROM/Strength/Tone: Deficits RLE ROM/Strength/Tone Deficits: Strength at least 3/5 with functional activity Left Lower Extremity Assessment LLE ROM/Strength/Tone: Unable to fully assess;Due to pain   Balance    End of Session PT - End of Session Activity Tolerance: Patient limited by pain Patient left: in chair;with call bell/phone within reach Nurse Communication: Patient requests pain meds;Need for lift equipment  GP     [facility] 07/17/2012, 12:21 PM [phone]

## 2012-07-17 NOTE — Progress Notes (Signed)
Clinical Social Work Department CLINICAL SOCIAL WORK PLACEMENT NOTE 07/17/2012  Patient:  [name], [name]  Account Number:  [id number] Admit date:  07/15/2012  Clinical Social Worker:  Leron Croak, CLINICAL SOCIAL WORKER  Date/time:  07/17/2012 09:43 AM  Clinical Social Work is seeking post-discharge placement for this patient at the following level of care:   SKILLED NURSING   (*CSW will update this form in Epic as items are completed)   07/16/2012  Patient/family provided with [facility] Department of Clinical Social Work's list of facilities offering this level of care within the geographic area requested by the patient (or if unable, by the patient's family).  07/16/2012  Patient/family informed of their freedom to choose among providers that offer the needed level of care, that participate in Medicare, Medicaid or managed care program needed by the patient, have an available bed and are willing to accept the patient.  07/16/2012  Patient/family informed of [facility]' ownership interest in [facility], as well as of the fact that they are under no obligation to receive care at this facility.  PASARR submitted to EDS on  PASARR number received from EDS on   FL2 transmitted to all facilities in geographic area requested by pt/family on  07/17/2012 FL2 transmitted to all facilities within larger geographic area on 07/17/2012  Patient informed that his/her managed care company has contracts with or will negotiate with  certain facilities, including the following:   Not [facility]     Patient/family informed of bed offers received:   Patient chooses bed at  Physician recommends and patient chooses bed at    Patient to be transferred to  on   Patient to be transferred to facility by   The following physician request were entered in Epic:   Additional Comments:  Leron Croak, Leeroy Bock Long Weekend Coverage [phone]

## 2012-07-17 NOTE — Op Note (Signed)
NAMECRISTALLE, [name]               ACCOUNT NO.:  [id number]  MEDICAL RECORD NO.:  [id number]  LOCATION:  1602                         FACILITY:  [facility]  PHYSICIAN:  Madlyn Frankel. Charlann Boxer, M.D.  DATE OF BIRTH:  1934-10-24  DATE OF PROCEDURE:  07/16/2012 DATE OF DISCHARGE:                              OPERATIVE REPORT   PREOPERATIVE DIAGNOSIS:  Left proximal femur fracture consistent with a reverse obliquity, subtroch extension-type pattern.  POSTOPERATIVE DIAGNOSIS:  Left proximal femur fracture consistent with a reverse obliquity, subtroch extension-type pattern.  PROCEDURE:  Open reduction and internal fixation utilizing a trochanteric nail locking the lag screw to make this a fixed angle device with a single distal interlock.  The nail was 11 x 108 mm, 130 degree lag screw.  SURGEON:  Madlyn Frankel. Charlann Boxer, M.D.  ASSISTANT:  Surgical team.  ANESTHESIA:  General.  SPECIMENS:  None.  COMPLICATION:  None.  BLOOD LOSS:  Probably less than 200 mL.  INDICATIONS FOR PROCEDURE:  Ms. Kapur is a 77 year old patient with history of bilateral total knee replacements.  She unfortunately had a fall at her home.  She was brought to the emergency room.  Fracture was identified.  She was admitted to the medical service.  She was seen and evaluated in consultation and scheduled for surgical fixation for mobility, healing, and allowing for function to be maintained.  Risks of infection and nonunion were discussed.  Consent was obtained.  PROCEDURE IN DETAIL:  The patient was brought to the operative theater. Once adequate anesthesia, preoperative antibiotics, Ancef administered, the patient was positioned supine with the left foot in a traction boot. The right leg was flexed and abducted out of the way with bony prominences padded, particularly laterally over the peroneal nerve.  Traction-internal rotation was applied and fluoroscopy was used to confirm reduction of the fracture.  At this point,  the left lateral hip was prepped and draped from the iliac crest to the knee area under sterile technique.  Fluoroscopy was brought into the field identifying the tip of the trochanter.  Incision was then made laterally.  A time-out was performed identifying the patient, planned procedure, and extremity.  Following this lateral incision, sharp dissection was carried through the gluteal fascia, which was incised.  The guidewire was then inserted into the tip of the trochanter.  Based on the body habitus, I did use a starting awl and inserted this into the proximal femur and then passed the guidewire across this.  After opening up the proximal femur and maintaining the fracture and almost reduced in a way possibly using a ball-tip pusher, I tried to open the proximal femur, drilled and tried to past the intramedullary nail.  The intramedullary nail was very tight in the canal at 11 mm and for that reason, I removed the intramedullary nail, passed a ball-tipped guidewire and reamed up with a 12.5 mm reamer.  At this point, I was able to pass the nail by hand to the appropriate depth.  Time was focused on trying to get this nail in the best orientation with the lag screw and these close to the center of the head in both AP and lateral planes as  possible but also trying to maintain stability to this proximal femoral segment.  Once the guidewire was positioned appropriately, I measured depth, choosing 110-mm screw, drilled and placed the screw and then proximally locked it down so as to prevent any compression.  Distal interlock screw measuring 36 mm was placed.  At this point, all wounds were irrigated.  The proximal gluteal fascia was reapproximated using #1 Vicryl.  All the wounds were closed with 2-0 Vicryl and staples.  The staple wounds were cleaned, dried, and dressed sterilely using Mepilex dressing.  She was then extubated and brought to the recovery room in stable  condition.  Postoperatively, I am going to have a repeat partial weightbearing until fracture healing in 6 weeks.  She will be seen back in the office in 2 weeks for staple removal.  If any other questions come up, we will be glad to see you.     Madlyn Frankel Charlann Boxer, M.D.     MDO/MEDQ  D:  07/16/2012  T:  07/17/2012  Job:  [id number]

## 2012-07-18 ENCOUNTER — Encounter (HOSPITAL_COMMUNITY): Payer: Self-pay | Admitting: Orthopedic Surgery

## 2012-07-18 DIAGNOSIS — S72009A Fracture of unspecified part of neck of unspecified femur, initial encounter for closed fracture: Secondary | ICD-10-CM

## 2012-07-18 DIAGNOSIS — R509 Fever, unspecified: Secondary | ICD-10-CM

## 2012-07-18 LAB — CBC
HCT: 24.3 % — ABNORMAL LOW (ref 36.0–46.0)
MCH: 31.9 pg (ref 26.0–34.0)
MCHC: 33.7 g/dL (ref 30.0–36.0)
MCV: 94.6 fL (ref 78.0–100.0)
Platelets: 131 10*3/uL — ABNORMAL LOW (ref 150–400)
RDW: 13.5 % (ref 11.5–15.5)
WBC: 6.5 10*3/uL (ref 4.0–10.5)

## 2012-07-18 LAB — BASIC METABOLIC PANEL
BUN: 16 mg/dL (ref 6–23)
Calcium: 7.7 mg/dL — ABNORMAL LOW (ref 8.4–10.5)
Chloride: 99 mEq/L (ref 96–112)
Creatinine, Ser: 0.77 mg/dL (ref 0.50–1.10)
GFR calc Af Amer: >90 mL/min (ref >90)

## 2012-07-18 NOTE — Progress Notes (Signed)
Physical Therapy Treatment Patient Details Name: [name] MRN: [id number] DOB: Nov 07, 1934 Today's Date: 07/18/2012 Time: 4098-1191 PT Time Calculation (min): 20 min  PT Assessment / Plan / Recommendation Comments on Treatment Session  Pt assisted off BSC (nsg tech performed hygiene) and pivoted to recliner.  Pt then performed exercises.    Follow Up Recommendations  Post acute inpatient rehab SNF   Barriers to Discharge        Equipment Recommendations  Tub/shower bench;Wheelchair (measurements);Wheelchair cushion (measurements)    Recommendations for Other Services    Frequency     Plan Discharge plan remains appropriate;Frequency remains appropriate    Precautions / Restrictions Precautions Precautions: Fall Restrictions Weight Bearing Restrictions: Yes LLE Weight Bearing: Partial weight bearing LLE Partial Weight Bearing Percentage or Pounds: 50%   Pertinent Vitals/Pain No pain at rest    Mobility  Bed Mobility Bed Mobility: Supine to Sit Supine to Sit: 1: +2 Total assist;With rails Supine to Sit: Patient Percentage: 40% Details for Bed Mobility Assistance: Pt req increased cues (VC/TC) for safety and sequencing, as well as to assist LE's off bed & sit upright. Bed sheet/pad used to assist w/ scoot to EOB. Transfers Transfers: Sit to Stand;Stand to Sit;Stand Pivot Transfers Sit to Stand: 1: +2 Total assist;With upper extremity assist;From chair/3-in-1 Sit to Stand: Patient Percentage: 40% Stand to Sit: 1: +2 Total assist;With upper extremity assist;To chair/3-in-1 Stand to Sit: Patient Percentage: 40% Stand Pivot Transfers: 1: +2 Total assist Stand Pivot Transfers: Patient Percentage: 50% Details for Transfer Assistance: verbal cues for safe technique, increased assist for rising and controlling descent, multiple verbal cues for safety including backing up to chair also did not reach back with sitting Ambulation/Gait Ambulation/Gait Assistance: Not tested  (comment)    Exercises Total Joint Exercises Ankle Circles/Pumps: AROM;Both;20 reps Quad Sets: AROM;Both;20 reps Gluteal Sets: AROM;Both;20 reps Towel Squeeze: AROM;Both;20 reps Short Arc QuadBarbaraann Boys;Left;15 reps Heel Slides: AAROM;Left;15 reps Hip ABduction/ADduction: AAROM;15 reps;Left   PT Diagnosis:    PT Problem List:   PT Treatment Interventions:     PT Goals Acute Rehab PT Goals PT Goal: Sit to Stand - Progress: Progressing toward goal PT Transfer Goal: Bed to Chair/Chair to Bed - Progress: Progressing toward goal  Visit Information  Last PT Received On: 07/18/12 Assistance Needed: +2    Subjective Data  Subjective: I haven't walked yet.   Cognition  Overall Cognitive Status: Appears within functional limits for tasks assessed/performed Arousal/Alertness: Awake/alert Orientation Level: Appears intact for tasks assessed Behavior During Session: [facility] for tasks performed    Balance     End of Session PT - End of Session Activity Tolerance: Patient limited by fatigue;Patient limited by pain Patient left: in chair;with call bell/phone within reach   GP     [facility] E 07/18/2012, 1:38 PM Pager: [phone]

## 2012-07-18 NOTE — Plan of Care (Signed)
Problem: Phase III Progression Outcomes Goal: Ambulate BID with assist as able Outcome: Not Progressing Not yet able to tolerate ambulation.

## 2012-07-18 NOTE — Evaluation (Signed)
Occupational Therapy Evaluation Patient Details Name: MORENA MCKISSACK MRN: [id number] DOB: 06/04/1935 Today's Date: 07/18/2012 Time: 4098-1191 OT Time Calculation (min): 28 min  OT Assessment / Plan / Recommendation Clinical Impression  Pt presents s/p L femoral nail proximal femur fracture. She is +2 total assist for squat pivot transfers, LB ADL's and bed mobility at this time. She will benefit from skilled OT in acute setting followed by SNF rehab prior to being d/c home w/ family to maximize independence w/ funct mobility, transfers and ADL/self care tasks.    OT Assessment  Patient needs continued OT Services    Follow Up Recommendations  Skilled nursing facility    Barriers to Discharge Decreased caregiver support;Inaccessible home environment    Equipment Recommendations  Tub/shower bench      Frequency  Min 2X/week    Precautions / Restrictions Precautions Precautions: Fall Restrictions Weight Bearing Restrictions: Yes LLE Weight Bearing: Partial weight bearing LLE Partial Weight Bearing Percentage or Pounds:  (50%)   Pertinent Vitals/Pain Pt reports LLE surgical pain prior to start of treatment. Pt reports being premedicated    ADL  Eating/Feeding: Simulated;Modified independent Where Assessed - Eating/Feeding: Chair Grooming: Simulated;Set up Where Assessed - Grooming: Supported sitting Upper Body Bathing: Simulated;Minimal assistance Where Assessed - Upper Body Bathing: Supine, head of bed up Lower Body Bathing: Performed;+1 Total assistance Where Assessed - Lower Body Bathing: Rolling right and/or left Upper Body Dressing: Simulated;Minimal assistance Where Assessed - Upper Body Dressing: Supported sitting;Supine, head of bed up Lower Body Dressing: Performed;Maximal assistance Where Assessed - Lower Body Dressing: Supine, head of bed flat;Supine, head of bed up Toilet Transfer: Simulated;+2 Total assistance Toilet Transfer: Patient Percentage: 40% Toilet  Transfer Method: Ambulance person: Bedside commode Toileting - Clothing Manipulation and Hygiene: Simulated;+2 Total assistance Tub/Shower Transfer Method: Not assessed Equipment Used: Other (comment) (attempted RW use, pt unable therefore squat pivot transfer) ADL Comments: Pt currently requires +2 assist for bed mobility and lower body ADL's. Squat pivot transfers to chair +2 assist, pt unable to use RW during transfer, thereofre +2 assist w/ UE support was performed. Pt will need SNF at D/C.    OT Diagnosis: Generalized weakness;Acute pain  OT Problem List: Decreased activity tolerance;Impaired balance (sitting and/or standing);Decreased safety awareness;Decreased knowledge of use of DME or AE;Decreased knowledge of precautions;Pain;Decreased strength OT Treatment Interventions: Self-care/ADL training;Therapeutic exercise;Energy conservation;DME and/or AE instruction;Therapeutic activities;Patient/family education;Balance training   OT Goals Acute Rehab OT Goals OT Goal Formulation: With patient Time For Goal Achievement: 08/01/12 Potential to Achieve Goals: Good ADL Goals Pt Will Perform Grooming: with modified independence;Sitting, edge of bed;Sitting at sink;Unsupported ADL Goal: Grooming - Progress: Goal set today Pt Will Perform Upper Body Bathing: with set-up;Sitting, chair;Sitting, edge of bed ADL Goal: Upper Body Bathing - Progress: Goal set today Pt Will Perform Lower Body Bathing: with min assist;Sit to stand from chair;Sit to stand from bed;with adaptive equipment ADL Goal: Lower Body Bathing - Progress: Goal set today Pt Will Perform Upper Body Dressing: with modified independence;Sitting, chair;Sitting, bed ADL Goal: Upper Body Dressing - Progress: Goal set today Pt Will Perform Lower Body Dressing: with min assist;Sit to stand from bed;Sit to stand from chair;with adaptive equipment ADL Goal: Lower Body Dressing - Progress: Goal set today Pt Will  Transfer to Toilet: with min assist;Stand pivot transfer;Squat pivot transfer;3-in-1 ADL Goal: Toilet Transfer - Progress: Goal set today Pt Will Perform Toileting - Clothing Manipulation: with min assist;Sitting on 3-in-1 or toilet ADL Goal: Toileting - Clothing  Manipulation - Progress: Goal set today Pt Will Perform Toileting - Hygiene: with supervision;Sitting on 3-in-1 or toilet ADL Goal: Toileting - Hygiene - Progress: Goal set today  Visit Information  Last OT Received On: 07/18/12 Assistance Needed: +2    Subjective Data  Subjective: I'm going to go to a rehab after this Patient Stated Goal: SNF rehab then home w/ daughter   Prior Functioning     Home Living Lives With: Daughter Available Help at Discharge: Family Type of Home: Mobile home Home Access: Stairs to enter Entrance Stairs-Number of Steps: 5-6 Entrance Stairs-Rails: Right;Left Home Layout: One level Bathroom Shower/Tub: Engineer, manufacturing systems: Standard Home Adaptive Equipment: Walker - rolling;Straight cane;Bedside commode/3-in-1 Prior Function Level of Independence: Needs assistance Needs Assistance: Meal Prep;Light Housekeeping Meal Prep: Total Light Housekeeping: Total Able to Take Stairs?: Yes Communication Communication: No difficulties Dominant Hand: Right    Vision/Perception  WFL's; wears glasses for reading only. No change from baseline   Cognition  Overall Cognitive Status: Appears within functional limits for tasks assessed/performed Arousal/Alertness: Awake/alert Orientation Level: Appears intact for tasks assessed Behavior During Session: [facility] for tasks performed    Extremity/Trunk Assessment Right Upper Extremity Assessment RUE ROM/Strength/Tone: Within functional levels RUE Sensation: WFL - Light Touch RUE Coordination: WFL - gross/fine motor Left Upper Extremity Assessment LUE ROM/Strength/Tone: Within functional levels LUE Sensation: WFL - Light Touch LUE Coordination:  WFL - gross/fine motor     Mobility Bed Mobility Bed Mobility: Supine to Sit Supine to Sit: 1: +2 Total assist;With rails Supine to Sit: Patient Percentage: 40% Details for Bed Mobility Assistance: Pt req increased cues (VC/TC) for safety and sequencing, as well as to assist LE's off bed & sit upright. Bed sheet/pad used to assist w/ scoot to EOB. Transfers Sit to Stand: With upper extremity assist;1: +2 Total assist;From bed;From chair/3-in-1 Sit to Stand: Patient Percentage: 40% Stand to Sit: 1: +2 Total assist;With upper extremity assist;To chair/3-in-1 Stand to Sit: Patient Percentage: 40% Details for Transfer Assistance: Attempted sit to stand from EOB w/ RW & pt unable therefore +2 assist w/ UE support for squat pivot transfer to chair. Pt requires increased cues for safety, sequencing, technique. Pt was supported throughout as she was not noted to be unable to take any step (pivoted to R side).                End of Session OT - End of Session Equipment Utilized During Treatment: Gait belt Activity Tolerance: Patient limited by pain Patient left: in chair;with call bell/phone within reach;with nursing in room Nurse Communication: Mobility status;Need for lift equipment;Weight bearing status  GO     Alm Bustard 07/18/2012, 10:52 AM

## 2012-07-18 NOTE — Progress Notes (Signed)
Patient ID: [name], female   DOB: 1935/04/12, 77 y.o.   MRN: [id number] Subjective: 2 Days Post-Op Procedure(s) (LRB): INTRAMEDULLARY (IM) NAIL FEMORAL (Left)    Patient reports pain as mild. Doing ok for now.  No events or complications early on  Objective:   VITALS:   Filed Vitals:   07/18/12 0425  BP: 133/75  Pulse: 91  Temp: 99.6 F (37.6 C)  Resp: 20    Neurovascular intact Incision: dressing C/D/I  LABS  Basename 07/18/12 0345 07/17/12 0422 07/16/12 0515  HGB 8.2* 9.5* 12.1  HCT 24.3* 28.7* 36.0  WBC 6.5 7.8 7.7  PLT 131* 141* 180     Basename 07/18/12 0345 07/17/12 0422 07/16/12 0515  NA 134* 132* 135  K 4.0 4.3 4.6  BUN 16 14 18   CREATININE 0.77 0.74 0.69  GLUCOSE 106* 115* 117*    No results found for this basename: LABPT:2,INR:2 in the last 72 hours   Assessment/Plan: 2 Days Post-Op Procedure(s) (LRB): INTRAMEDULLARY (IM) NAIL FEMORAL (Left)   Advance diet Up with therapy Discharge to SNF when medically stable Will follow Will need follow up in 2-3 weeks for wound check and xrays Limited weight bearing LLE

## 2012-07-18 NOTE — Progress Notes (Signed)
[facility] PROGRESS NOTE  [name] ZOX:[id number] DOB: November 01, 1934 DOA: 07/15/2012 PCP: Windy Carina, PA-C  Brief narrative:  65 female with OA, hx of AS, recent knee replacement with fall at home with left femur intertrochanteric fracture.   Assessment/Plan:  Intertrochanteric fracture of femur.  seen by ortho and s/p IM nailing done today stable post op continue pain control with low dose prn morphine and robaxin for muscle spasms  PT eval  DVT prophylaxis with sq lovenox   Fever  MAXIMUM TEMPERATURE of 102.5 overnight UA and cxr unremarkable. Ordered for blood culture. Will follow. Repeat chest x-ray if febrile again  HTN  cont Norvasc   Anemia Likely due to blood loss postoperatively.  will monitor.  Code Status: full  Family Communication: none at bedside. Daughter involved in care   Disposition Plan:  Will need SNF . Right hip tomorrow if stable overnight Consultants:  Dr Charlann Boxer ( ortho) Procedures:  ORIF with IM nailing of left femur fracture on 9/28  Antibiotics:  none    HPI/Subjective: She again had a temperature spike of 102.5 overnight. UA and chest x-ray done yesterday was unremarkable. Patient denies any specific complaint except for left hip pain.  Objective: Filed Vitals:   07/18/12 0425 07/18/12 0800 07/18/12 1200 07/18/12 1341  BP: 133/75   134/71  Pulse: 91   110  Temp: 99.6 F (37.6 C)   98.5 F (36.9 C)  TempSrc: Oral   Oral  Resp: 20 15 16 20   Height:      Weight:      SpO2: 98% 100% 98% 98%    Intake/Output Summary (Last 24 hours) at 07/18/12 1455 Last data filed at 07/18/12 1340  Gross per 24 hour  Intake 1454.17 ml  Output   1900 ml  Net -445.83 ml   Filed Weights   07/15/12 2330  Weight: 85.3 kg (188 lb 0.8 oz)    Exam:  General: Elderly female lying in bed in NAD  HEENT: No pallor, moist oral mucosa Cardiovascular: N S1 &S2, no murmurs  Respiratory: clear b/l, no added sounds  Abdomen: soft, NT, ND, BS+    Ext: warm, dressing over left hip intact , CNS: sleepy but arousable. Non focal    Data Reviewed: Basic Metabolic Panel:  Lab 07/18/12 8119 07/17/12 0422 07/16/12 0515 07/15/12 1825  NA 134* 132* 135 132*  K 4.0 4.3 4.6 4.6  CL 99 99 101 98  CO2 30 27 26 24   GLUCOSE 106* 115* 117* 149*  BUN 16 14 18 19   CREATININE 0.77 0.74 0.69 0.71  CALCIUM 7.7* 7.8* 8.3* 8.6  MG -- -- -- --  PHOS -- -- -- --   Liver Function Tests:  Lab 07/15/12 1825  AST 33  ALT 17  ALKPHOS 75  BILITOT 0.3  PROT 7.4  ALBUMIN 3.3*   No results found for this basename: LIPASE:5,AMYLASE:5 in the last 168 hours No results found for this basename: AMMONIA:5 in the last 168 hours CBC:  Lab 07/18/12 0345 07/17/12 0422 07/16/12 0515 07/15/12 1825  WBC 6.5 7.8 7.7 8.3  NEUTROABS -- -- -- --  HGB 8.2* 9.5* 12.1 13.7  HCT 24.3* 28.7* 36.0 40.7  MCV 94.6 94.7 94.2 94.0  PLT 131* 141* 180 232   Cardiac Enzymes: No results found for this basename: CKTOTAL:5,CKMB:5,CKMBINDEX:5,TROPONINI:5 in the last 168 hours BNP (last 3 results)  Basename 07/15/12 1825  PROBNP 575.6*   CBG: No results found for this basename: GLUCAP:5 in  the last 168 hours  Recent Results (from the past 240 hour(s))  MRSA PCR SCREENING     Status: Abnormal   Collection Time   07/16/12 10:47 AM      Component Value Range Status Comment   MRSA by PCR POSITIVE (*) NEGATIVE Final      Studies: Dg Chest Port 1 View  07/17/2012  *RADIOLOGY REPORT*  Clinical Data: Rule out pneumonia.  No stenosis.  Dyspnea.  PORTABLE CHEST - 1 VIEW  Comparison: 07/15/2012  Findings: Prior median sternotomy. Normal heart size.  No pleural effusion or pneumothorax.  Minimal subsegmental atelectasis at the left lung base.  The right lung is clear.  IMPRESSION: No acute cardiopulmonary disease.   Original Report Authenticated By: Consuello Bossier, M.D.     Scheduled Meds:   . amLODipine  5 mg Oral Daily  . docusate sodium  100 mg Oral BID  . enoxaparin  (LOVENOX) injection  40 mg Subcutaneous Daily  . loratadine  10 mg Oral Daily  . polyethylene glycol  17 g Oral Daily  . sertraline  50 mg Oral Daily   Continuous Infusions:   . sodium chloride 0.9 % 1,000 mL with potassium chloride 10 mEq infusion 50 mL/hr at 07/18/12 1446       Time spent: 25 minutes    [name]  [facility] Pager [phone]. If 8PM-8AM, please contact night-coverage at [email or web address], password [email or web address] 07/18/2012, 2:55 PM  LOS: 3 days

## 2012-07-18 NOTE — Progress Notes (Signed)
Utilization review completed.  

## 2012-07-18 NOTE — Clinical Documentation Improvement (Signed)
Anemia Blood Loss Clarification  THIS DOCUMENT IS NOT A PERMANENT PART OF THE MEDICAL RECORD  RESPOND TO THE THIS QUERY, FOLLOW THE INSTRUCTIONS BELOW:  1. If needed, update documentation for the patient's encounter via the notes activity.  2. Access this query again and click edit on the In [employer].  3. After updating, or not, click F2 to complete all highlighted (required) fields concerning your review. Select "additional documentation in the medical record" OR "no additional documentation provided".  4. Click Sign note button.  5. The deficiency will fall out of your In Basket *Please let us know if you are not able to complete this workflow by phone or e-mail (listed below).        07/18/12  Dear Dr.N. Alissa Pharr/Associates  In an effort to better capture your patient's severity of illness, reflect appropriate length of stay and utilization of resources, a review of the patient medical record has revealed the following indicators.    Based on your clinical judgment, please clarify and document in a progress note and/or discharge summary the clinical condition associated with the following supporting information:  In responding to this query please exercise your independent judgment.  The fact that a query is asked, does not imply that any particular answer is desired or expected.  Based on your clinical judgment can you provide a diagnosis that represents the below listed clinical indicators?  In this pt admitted for Intertrochanteric fracture requiring a left INTRAMEDULLARY (IM) NAIL FEMORAL a review of the medical records indicates the following:   Pre Op H/H=13.7/40.7  Post Op H/H=8.2/24.3  EBL:  Please clarify if the above clinical indicators reflect any of the diagnosis listed below and document in the pn or the d/c summary.   Possible Clinical Conditions?   " Expected Acute Blood Loss Anemia  " Acute Blood Loss Anemia  " Acute on chronic blood loss  anemia   " Other Condition________________  " Cannot Clinically Determine  Risk Factors: (recent surgery, pre op anemia, EBL in OR)  Supporting Information:  Signs and Symptoms  IM nailing, Left  Intertrochanteric fracture,   Diagnostics: Component     Latest Ref Rng 07/15/2012  Hemoglobin     12.0 - 15.0 g/dL 28.4  HCT     13.2 - 44.0 % 40.7   Component     Latest Ref Rng 07/17/2012 07/18/2012  Hemoglobin     12.0 - 15.0 g/dL 9.5 (L) 8.2 (L)  HCT     36.0 - 46.0 % 28.7 (L) 24.3 (L)    Treatments: Normal saline continuous Serial H&H monitoring Monitoring  Reviewed:   Thank You,  Enis Slipper  RN, BSN, MSN/Inf, CCDS Clinical Documentation Specialist [facility] Dept Pager: [phone] / E-mail: [email or web address]  Health Information Management [place]

## 2012-07-19 MED ORDER — POLYETHYLENE GLYCOL 3350 17 G PO PACK: 17.0000 g | PACK | Freq: Two times a day (BID) | ORAL | Status: DC

## 2012-07-19 MED ORDER — DSS 100 MG PO CAPS: 100.0000 mg | ORAL_CAPSULE | Freq: Two times a day (BID) | ORAL | Status: DC

## 2012-07-19 MED ORDER — HYDROCODONE-ACETAMINOPHEN 5-325 MG PO TABS: 1.0000 | ORAL_TABLET | ORAL | Status: DC | PRN

## 2012-07-19 MED ORDER — METHOCARBAMOL 500 MG PO TABS: 500.0000 mg | ORAL_TABLET | Freq: Four times a day (QID) | ORAL | Status: DC | PRN

## 2012-07-19 MED ORDER — ENOXAPARIN SODIUM 40 MG/0.4ML ~~LOC~~ SOLN: 40.0000 mg | Freq: Every day | SUBCUTANEOUS | Status: DC

## 2012-07-19 NOTE — Plan of Care (Signed)
Problem: Discharge Progression Outcomes Goal: Barriers To Progression Addressed/Resolved Outcome: Progressing Not yet tolerating more than transfers. For SNF rehab. Alfonzia Woolum, Chief Financial Officer    Goal: Activity appropriate for discharge plan Outcome: Adequate for Discharge To SNF

## 2012-07-19 NOTE — Progress Notes (Signed)
Patient had post void residual greater than 800 cc per bladder scan.  Catheter was inserted following [place] guidelines as 2 I&O catheterizations had already been performed.  1100 cc of urine was emptied following insertion.

## 2012-07-19 NOTE — Progress Notes (Signed)
   Subjective: 3 Days Post-Op Procedure(s) (LRB): INTRAMEDULLARY (IM) NAIL FEMORAL (Left)   Patient reports pain as mild, pain well controlled. Issues with not urinating last night, otherwise no events throughout the night.  Objective:   VITALS:   Filed Vitals:   07/19/12 0530  BP: 129/71  Pulse: 103  Temp: 98.4 F (36.9 C)  Resp: 20    Neurovascular intact Dorsiflexion/Plantar flexion intact Incision: scant drainage No cellulitis present Compartment soft  LABS  Basename 07/18/12 0345 07/17/12 0422  HGB 8.2* 9.5*  HCT 24.3* 28.7*  WBC 6.5 7.8  PLT 131* 141*     Basename 07/18/12 0345 07/17/12 0422  NA 134* 132*  K 4.0 4.3  BUN 16 14  CREATININE 0.77 0.74  GLUCOSE 106* 115*     Assessment/Plan: 3 Days Post-Op Procedure(s) (LRB): INTRAMEDULLARY (IM) NAIL FEMORAL (Left)  Up with therapy Discharge to SNF when ready medically Orthopaedically stable for discharge when medically ready PWB 50% on the left leg Lovenox 40 mg daily for 2 weeks, Rx on chart Norco for pain and Robaxin for muscle relaxer, RX on chart Follow up in 2 weeks at [facility].  Follow-up Information    Follow up with OLIN,Bhavya Grand D in 2 weeks.   Contact information:   [facility] [address] [place] [address] [phone]            Anastasio Auerbach. Raeven Pint   PAC  07/19/2012, 11:25 AM

## 2012-07-19 NOTE — Progress Notes (Signed)
Discharged from floor via stretcher, accomp by EMS. No changes in assessment. Koya Hunger, [employer]

## 2012-07-19 NOTE — Progress Notes (Signed)
CSW assisting with d/c planning. Pt has chosen [facility] for her rehab placement. SNF bed is available today if pt is ready for d/c. CSW will assist with d/c planning to SNF.  Asher Muir Labresha Mellor LCSW [phone]

## 2012-07-19 NOTE — Discharge Summary (Signed)
Physician Discharge Summary  [name] ZOX:[id number] DOB: 09/03/1935 DOA: 07/15/2012  PCP: Windy Carina, PA-C  Admit date: 07/15/2012 Discharge date: 07/19/2012  Recommendations for Outpatient Follow-up:  1. Discharge to rehab with outpatient follow up with ortho and PCP.   Discharge Diagnoses:  Principal Problem:  *left Intertrochanteric fracture  Active Problems:  Morbid obesity  Hx of AVR  Fever   Discharge Condition: fair  Diet recommendation: low sodium  Filed Weights   07/15/12 2330  Weight: 85.3 kg (188 lb 0.8 oz)    History of present illness:  Please refer to admission H&P in detail but in brief, 64 female with OA, hx of AS, recent knee replacement with fall at home with left femur intertrochanteric fracture.   Hospital Course:    Intertrochanteric fracture of femur.  seen by ortho and s/p IM nailing done today stable post op continue pain control with low dose prn morphine and robaxin for muscle spasms  PT eval recommended for SNF DVT prophylaxis with sq lovenox  Follow up with Dr Charlann Boxer in 2 weeks  Fever  She was febrile for 2 days post op with t max of 102.5. CXR, UA and blood cx unremarkable. Possibly due to pain post op and not given any antibiotics. She is afebrile for past 36 hrs.   HTN  cont Norvasc   Anemia  Likely due to blood loss postoperatively. Please recheck H&H in 3-4 days   Code Status: full    Consultants:  Dr Charlann Boxer ( ortho)   Procedures:  ORIF with IM nailing of left femur fracture on 9/28    Antibiotics:  none     Procedures:  Left femoral  IM nailing    Consultations:  Dr Charlann Boxer ( ortho)  Discharge Exam: Filed Vitals:   07/18/12 1200 07/18/12 1341 07/18/12 2118 07/19/12 0530  BP:  134/71 112/66 129/71  Pulse:  110 113 103  Temp:  98.5 F (36.9 C) 99.7 F (37.6 C) 98.4 F (36.9 C)  TempSrc:  Oral Oral Oral  Resp: 16 20 20 20   Height:      Weight:      SpO2: 98% 98% 97% 92%   General: Elderly female  lying in bed in NAD  HEENT: No pallor, moist oral mucosa  Cardiovascular: N S1 &S2, no murmurs  Respiratory: clear b/l, no added sounds  Abdomen: soft, NT, ND, BS+  Ext: warm, dressing over left hip intact  , CNS: AAOX 3  Non focal    Discharge Instructions  Discharge Orders    Future Orders Please Complete By Expires   Diet - low sodium heart healthy      Call MD / Call 911      Comments:   If you experience chest pain or shortness of breath, CALL 911 and be transported to the hospital emergency room.  If you develope a fever above 101 F, pus (white drainage) or increased drainage or redness at the wound, or calf pain, call your surgeon's office.   Discharge instructions      Comments:   Daily dressing changes with 4x4 gauze and tape. Keep the area dry and clean until follow up. Follow up in 2 weeks at [facility]. Call with any questions or concerns.  PWB 50% left leg.   Constipation Prevention      Comments:   Drink plenty of fluids.  Prune juice may be helpful.  You may use a stool softener, such as Colace (over the counter) 100  mg twice a day.  Use MiraLax (over the counter) for constipation as needed.   Increase activity slowly as tolerated      Comments:   PWB 50% left leg.   Driving restrictions      Comments:   No driving for 4 weeks   Change dressing      Comments:   Daily dressing changes with 4x4 guaze and tape. Keep the area dry and clean.   TED hose      Comments:   Use stockings (TED hose) for 2 weeks on both leg(s).  You may remove them at night for sleeping.       Medication List     As of 07/19/2012 11:41 AM    STOP taking these medications         HYDROcodone-acetaminophen 5-500 MG per tablet   Commonly known as: VICODIN   Replaced by: HYDROcodone-acetaminophen 5-325 MG per tablet      TAKE these medications         amLODipine 5 MG tablet   Commonly known as: NORVASC   Take 5 mg by mouth daily.      cetirizine 10 MG tablet    Commonly known as: ZYRTEC   Take 10 mg by mouth daily.      DSS 100 MG Caps   Take 100 mg by mouth 2 (two) times daily.      enoxaparin 40 MG/0.4ML injection   Commonly known as: LOVENOX   Inject 0.4 mLs (40 mg total) into the skin daily.      HYDROcodone-acetaminophen 5-325 MG per tablet   Commonly known as: NORCO/VICODIN   Take 1 tablet by mouth every 4 (four) hours as needed for pain.      methocarbamol 500 MG tablet   Commonly known as: ROBAXIN   Take 1 tablet (500 mg total) by mouth every 6 (six) hours as needed (muscle spasms).      polyethylene glycol packet   Commonly known as: MIRALAX / GLYCOLAX   Take 17 g by mouth 2 (two) times daily.      ASK your doctor about these medications         nystatin cream   Commonly known as: MYCOSTATIN   Apply topically 2 (two) times daily.      sertraline 50 MG tablet   Commonly known as: ZOLOFT   Take 50 mg by mouth daily.           Follow-up Information    Follow up with Shelda Pal, MD. Schedule an appointment as soon as possible for a visit in 2 weeks.   Contact information:   [facility] [address], [address] [place] [address] [phone]       Follow up with Windy Carina, PA-C. In 2 weeks.   Contact information:   [address] [place] [address] [phone]           The results of significant diagnostics from this hospitalization (including imaging, microbiology, ancillary and laboratory) are listed below for reference.    Significant Diagnostic Studies: Dg Chest 2 View  07/10/2012  *RADIOLOGY REPORT*  Clinical Data: Status post fall. Pain  CHEST - 2 VIEW  Comparison: 06/24/2011  Findings: Previous median sternotomy and aortic valve replacement. Heart size is normal.  No pleural effusion or edema.  No airspace consolidation.  There is scarring noted within both lung bases. The bony thorax appears intact.  IMPRESSION:  1.  No acute cardiopulmonary abnormalities.  Original  Report Authenticated By: Rosealee Albee, M.D.    Dg Hip Complete Left  07/15/2012  *RADIOLOGY REPORT*  Clinical Data: Left hip pain  LEFT HIP - COMPLETE 2+ VIEW  Comparison: None.  Findings: There is an acute intertrochanteric fracture of the left femur with varus angulation and mild override.  The hip is located.  Right hip is grossly normal.  IMPRESSION: Acute intertrochanteric fracture of the left femur.   Original Report Authenticated By: Genevive Bi, M.D.    Dg Femur Left  07/16/2012  *RADIOLOGY REPORT*  Clinical Data: Placement of intermedullary nail across proximal femur.  LEFT FEMUR - 2 VIEW  Comparison: Preoperative films of 1 day prior.  Findings: Placement of an intermedullary rod with proximal screw fixation device across the previously described intertrochanteric femur fracture. No acute hardware complication.  Alignment is improved.  IMPRESSION: Intraoperative imaging of proximal femoral fixation.   Original Report Authenticated By: Consuello Bossier, M.D.    Dg Chest Port 1 View  07/17/2012  *RADIOLOGY REPORT*  Clinical Data: Rule out pneumonia.  No stenosis.  Dyspnea.  PORTABLE CHEST - 1 VIEW  Comparison: 07/15/2012  Findings: Prior median sternotomy. Normal heart size.  No pleural effusion or pneumothorax.  Minimal subsegmental atelectasis at the left lung base.  The right lung is clear.  IMPRESSION: No acute cardiopulmonary disease.   Original Report Authenticated By: Consuello Bossier, M.D.    Dg Chest Portable 1 View  07/15/2012  *RADIOLOGY REPORT*  Clinical Data: Status post fall.  Shortness of breath.  PORTABLE CHEST - 1 VIEW  Comparison: 07/10/2012.  Findings: There may be mild rightward tracheal deviation, which can be seen with left thyroid enlargement.  Heart size stable. Thoracic aorta is calcified.  Lungs are low in volume with mild diffuse interstitial prominence.  Minimal left basilar airspace disease.  Old right rib fracture.  IMPRESSION:  1.  Mild diffuse interstitial  prominence may be due to low lung volumes.  Pulmonary edema could also have this appearance. 2.  Minimal left basilar airspace disease, possibly due to atelectasis.   Original Report Authenticated By: Reyes Ivan, M.D.    Dg C-arm 1-60 Min-no Report  07/16/2012  CLINICAL DATA: fractured left hip   C-ARM 1-60 MINUTES  Fluoroscopy was utilized by the requesting physician.  No radiographic  interpretation.      Microbiology: Recent Results (from the past 240 hour(s))  MRSA PCR SCREENING     Status: Abnormal   Collection Time   07/16/12 10:47 AM      Component Value Range Status Comment   MRSA by PCR POSITIVE (*) NEGATIVE Final   CULTURE, BLOOD (ROUTINE X 2)     Status: Normal (Preliminary result)   Collection Time   07/18/12  9:20 AM      Component Value Range Status Comment   Specimen Description BLOOD RIGHT ARM   Final    Special Requests BOTTLES DRAWN AEROBIC ONLY 1CC   Final    Culture  Setup Time 07/18/2012 15:37   Final    Culture     Final    Value:        BLOOD CULTURE RECEIVED NO GROWTH TO DATE CULTURE WILL BE HELD FOR 5 DAYS BEFORE ISSUING A FINAL NEGATIVE REPORT   Report Status PENDING   Incomplete   CULTURE, BLOOD (ROUTINE X 2)     Status: Normal (Preliminary result)   Collection Time   07/18/12  9:30 AM  Component Value Range Status Comment   Specimen Description BLOOD RIGHT HAND   Final    Special Requests BOTTLES DRAWN AEROBIC AND ANAEROBIC [facility]   Final    Culture  Setup Time 07/18/2012 15:37   Final    Culture     Final    Value:        BLOOD CULTURE RECEIVED NO GROWTH TO DATE CULTURE WILL BE HELD FOR 5 DAYS BEFORE ISSUING A FINAL NEGATIVE REPORT   Report Status PENDING   Incomplete      Labs: Basic Metabolic Panel:  Lab 07/18/12 4782 07/17/12 0422 07/16/12 0515 07/15/12 1825  NA 134* 132* 135 132*  K 4.0 4.3 4.6 4.6  CL 99 99 101 98  CO2 30 27 26 24   GLUCOSE 106* 115* 117* 149*  BUN 16 14 18 19   CREATININE 0.77 0.74 0.69 0.71  CALCIUM 7.7* 7.8* 8.3*  8.6  MG -- -- -- --  PHOS -- -- -- --   Liver Function Tests:  Lab 07/15/12 1825  AST 33  ALT 17  ALKPHOS 75  BILITOT 0.3  PROT 7.4  ALBUMIN 3.3*   No results found for this basename: LIPASE:5,AMYLASE:5 in the last 168 hours No results found for this basename: AMMONIA:5 in the last 168 hours CBC:  Lab 07/18/12 0345 07/17/12 0422 07/16/12 0515 07/15/12 1825  WBC 6.5 7.8 7.7 8.3  NEUTROABS -- -- -- --  HGB 8.2* 9.5* 12.1 13.7  HCT 24.3* 28.7* 36.0 40.7  MCV 94.6 94.7 94.2 94.0  PLT 131* 141* 180 232   Cardiac Enzymes: No results found for this basename: CKTOTAL:5,CKMB:5,CKMBINDEX:5,TROPONINI:5 in the last 168 hours BNP: BNP (last 3 results)  Basename 07/15/12 1825  PROBNP 575.6*   CBG: No results found for this basename: GLUCAP:5 in the last 168 hours  Time coordinating discharge: 40  minutes  Signed:  Niyana Chesbro  [facility] 07/19/2012, 11:41 AM

## 2012-07-20 NOTE — Progress Notes (Signed)
Clinical Social Work Department CLINICAL SOCIAL WORK PLACEMENT NOTE 07/20/2012  Patient:  [name], [name]  Account Number:  [id number] Admit date:  07/15/2012  Clinical Social Worker:  Leron Croak, CLINICAL SOCIAL WORKER  Date/time:  07/17/2012 09:43 AM  Clinical Social Work is seeking post-discharge placement for this patient at the following level of care:   SKILLED NURSING   (*CSW will update this form in Epic as items are completed)   07/16/2012  Patient/family provided with [facility] Department of Clinical Social Work's list of facilities offering this level of care within the geographic area requested by the patient (or if unable, by the patient's family).  07/16/2012  Patient/family informed of their freedom to choose among providers that offer the needed level of care, that participate in Medicare, Medicaid or managed care program needed by the patient, have an available bed and are willing to accept the patient.  07/16/2012  Patient/family informed of [facility]' ownership interest in [facility], as well as of the fact that they are under no obligation to receive care at this facility.  PASARR submitted to EDS on  PASARR number received from EDS on   FL2 transmitted to all facilities in geographic area requested by pt/family on  07/17/2012 FL2 transmitted to all facilities within larger geographic area on 07/17/2012  Patient informed that his/her managed care company has contracts with or will negotiate with  certain facilities, including the following:   Not [facility]     Patient/family informed of bed offers received:  07/18/2012 Patient chooses bed at [facility] Physician recommends and patient chooses bed at    Patient to be transferred to [facility] on  07/19/2012 Patient to be transferred to facility by [facility]  The following physician request were entered in Epic:   Additional  Comments:  Pt/family decided to accept bed at [facility] on 10/1. [facility] was notified of change in plan.

## 2012-07-24 LAB — CULTURE, BLOOD (ROUTINE X 2)
Culture: NO GROWTH
Culture: NO GROWTH

## 2012-11-11 ENCOUNTER — Ambulatory Visit: Payer: Medicare Other | Admitting: Cardiology

## 2012-11-16 ENCOUNTER — Other Ambulatory Visit: Payer: Self-pay | Admitting: Neurology

## 2012-11-16 DIAGNOSIS — R6889 Other general symptoms and signs: Secondary | ICD-10-CM

## 2012-11-16 DIAGNOSIS — R269 Unspecified abnormalities of gait and mobility: Secondary | ICD-10-CM

## 2012-11-16 DIAGNOSIS — D518 Other vitamin B12 deficiency anemias: Secondary | ICD-10-CM

## 2012-11-21 ENCOUNTER — Ambulatory Visit
Admission: RE | Admit: 2012-11-21 | Discharge: 2012-11-21 | Disposition: A | Discharge status: Home / Self Care | Payer: Medicare HMO | Source: Ambulatory Visit | Attending: Neurology | Admitting: Neurology

## 2012-11-21 DIAGNOSIS — R269 Unspecified abnormalities of gait and mobility: Secondary | ICD-10-CM

## 2012-11-21 DIAGNOSIS — R6889 Other general symptoms and signs: Secondary | ICD-10-CM

## 2012-11-21 DIAGNOSIS — D518 Other vitamin B12 deficiency anemias: Secondary | ICD-10-CM

## 2012-11-26 ENCOUNTER — Emergency Department (HOSPITAL_COMMUNITY): Payer: Medicare HMO

## 2012-11-26 ENCOUNTER — Emergency Department (HOSPITAL_COMMUNITY)
Admission: EM | Admit: 2012-11-26 | Discharge: 2012-11-26 | Disposition: A | Discharge status: Home / Self Care | Payer: Medicare HMO | Attending: Emergency Medicine | Admitting: Emergency Medicine

## 2012-11-26 ENCOUNTER — Encounter (HOSPITAL_COMMUNITY): Payer: Self-pay | Admitting: *Deleted

## 2012-11-26 DIAGNOSIS — Z862 Personal history of diseases of the blood and blood-forming organs and certain disorders involving the immune mechanism: Secondary | ICD-10-CM | POA: Insufficient documentation

## 2012-11-26 DIAGNOSIS — Z79899 Other long term (current) drug therapy: Secondary | ICD-10-CM | POA: Insufficient documentation

## 2012-11-26 DIAGNOSIS — Z8719 Personal history of other diseases of the digestive system: Secondary | ICD-10-CM | POA: Insufficient documentation

## 2012-11-26 DIAGNOSIS — Z8679 Personal history of other diseases of the circulatory system: Secondary | ICD-10-CM | POA: Insufficient documentation

## 2012-11-26 DIAGNOSIS — R202 Paresthesia of skin: Secondary | ICD-10-CM

## 2012-11-26 DIAGNOSIS — Z8781 Personal history of (healed) traumatic fracture: Secondary | ICD-10-CM | POA: Insufficient documentation

## 2012-11-26 DIAGNOSIS — R209 Unspecified disturbances of skin sensation: Secondary | ICD-10-CM | POA: Insufficient documentation

## 2012-11-26 DIAGNOSIS — F3289 Other specified depressive episodes: Secondary | ICD-10-CM | POA: Insufficient documentation

## 2012-11-26 DIAGNOSIS — Z87891 Personal history of nicotine dependence: Secondary | ICD-10-CM | POA: Insufficient documentation

## 2012-11-26 DIAGNOSIS — Z7982 Long term (current) use of aspirin: Secondary | ICD-10-CM | POA: Insufficient documentation

## 2012-11-26 DIAGNOSIS — R200 Anesthesia of skin: Secondary | ICD-10-CM

## 2012-11-26 DIAGNOSIS — Z8639 Personal history of other endocrine, nutritional and metabolic disease: Secondary | ICD-10-CM | POA: Insufficient documentation

## 2012-11-26 DIAGNOSIS — F329 Major depressive disorder, single episode, unspecified: Secondary | ICD-10-CM | POA: Insufficient documentation

## 2012-11-26 DIAGNOSIS — Z8739 Personal history of other diseases of the musculoskeletal system and connective tissue: Secondary | ICD-10-CM | POA: Insufficient documentation

## 2012-11-26 LAB — CBC WITH DIFFERENTIAL/PLATELET
Basophils Relative: 1 % (ref 0–1)
Eosinophils Absolute: 0.2 10*3/uL (ref 0.0–0.7)
Eosinophils Relative: 5 % (ref 0–5)
HCT: 39.4 % (ref 36.0–46.0)
Hemoglobin: 12.5 g/dL (ref 12.0–15.0)
MCH: 29.4 pg (ref 26.0–34.0)
MCHC: 31.7 g/dL (ref 30.0–36.0)
MCV: 92.7 fL (ref 78.0–100.0)
Monocytes Absolute: 0.5 10*3/uL (ref 0.1–1.0)
Monocytes Relative: 11 % (ref 3–12)

## 2012-11-26 LAB — COMPREHENSIVE METABOLIC PANEL
Albumin: 3 g/dL — ABNORMAL LOW (ref 3.5–5.2)
BUN: 16 mg/dL (ref 6–23)
Chloride: 106 mEq/L (ref 96–112)
Creatinine, Ser: 0.78 mg/dL (ref 0.50–1.10)
Total Bilirubin: 0.2 mg/dL — ABNORMAL LOW (ref 0.3–1.2)
Total Protein: 6.7 g/dL (ref 6.0–8.3)

## 2012-11-26 LAB — TROPONIN I: Troponin I: <0.3 ng/mL (ref <0.30)

## 2012-11-26 NOTE — ED Notes (Signed)
Pt lives with Family and EMS was called because Pt has a @ day Hx of tingling in both hands. No other Sx's.

## 2012-11-26 NOTE — ED Provider Notes (Signed)
History     CSN: [id number]  Arrival date & time 11/26/12  1304   First MD Initiated Contact with Patient 11/26/12 1432      Chief Complaint  Patient presents with  . Tingling    (Consider location/radiation/quality/duration/timing/severity/associated sxs/prior treatment) HPI Comments: Patient presents with tingling in both arms for the past two days.  She denies headache or neck pain.  There is no chest pain or shortness of breath.  She denies any injury or trauma.  She describes this as a tingling sensation but strength and sensation she reports are okay.  History of valve replacement in the past.    The history is provided by the patient.    Past Medical History  Diagnosis Date  . Vitamin D deficiency   . OA (osteoarthritis)   . Morbid obesity   . Fracture of rib of left side   . Aortic stenosis   . Dyspnea     DYSPNEA WITH EXERTION  . Constipation     OCCASIONAL CONSTIPATION  . Depression     Past Surgical History  Procedure Laterality Date  . Cataract surgery    . Tonsillectomy    . Ovary removed secondary to cust    . 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 Tonsillectomy      as child  . Joint replacement      L TOTAL KNEE 05/2011  . Dental surgery      TEETH EXTRACTED  . Total knee arthroplasty  08/24/2011    Procedure: TOTAL KNEE ARTHROPLASTY;  Surgeon: Shelda Pal;  Location: [facility];  Service: Orthopedics;  Laterality: Right;  with Femoral Nerve Block  . Femur im nail  07/16/2012    Procedure: INTRAMEDULLARY (IM) NAIL FEMORAL;  Surgeon: Shelda Pal, MD;  Location: [facility];  Service: Orthopedics;  Laterality: Left;    Family History  Problem Relation Age of Onset  . Cancer    . Diabetes      History  Substance Use Topics  . Smoking status: Former Smoker -- 25 years    Quit date: 10/20/1979  . Smokeless tobacco: Not on file  . Alcohol Use: No    OB History   Grav Para Term Preterm Abortions TAB SAB Ect Mult Living                   Review of Systems  All other systems reviewed and are negative.    Allergies  Penicillins  Home Medications   Current Outpatient Rx  Name  Route  Sig  Dispense  Refill  . amLODipine (NORVASC) 5 MG tablet   Oral   Take 5 mg by mouth daily.         Marland Kitchen aspirin 81 MG tablet   Oral   Take 81 mg by mouth daily.         . cetirizine (ZYRTEC) 10 MG tablet   Oral   Take 10 mg by mouth daily.         Marland Kitchen docusate sodium 100 MG CAPS   Oral   Take 100 mg by mouth 2 (two) times daily.   10 capsule      . enoxaparin (LOVENOX) 40 MG/0.4ML injection   Subcutaneous   Inject 0.4 mLs (40 mg total) into the skin daily.   14 Syringe   0   . HYDROcodone-acetaminophen (NORCO/VICODIN) 5-325 MG per tablet   Oral   Take 1 tablet by mouth every 4 (four) hours as  needed for pain.   120 tablet   0   . methocarbamol (ROBAXIN) 500 MG tablet   Oral   Take 1 tablet (500 mg total) by mouth every 6 (six) hours as needed (muscle spasms).   50 tablet   0   . nystatin cream (MYCOSTATIN)   Topical   Apply topically 2 (two) times daily.         . polyethylene glycol (MIRALAX / GLYCOLAX) packet   Oral   Take 17 g by mouth 2 (two) times daily.   14 each      . sertraline (ZOLOFT) 50 MG tablet   Oral   Take 50 mg by mouth daily.           BP 135/62  Pulse 87  Temp(Src) 98.2 F (36.8 C) (Oral)  Resp 22  SpO2 99%  Physical Exam  Nursing note and vitals reviewed. Constitutional: She is oriented to person, place, and time. She appears well-developed and well-nourished. No distress.  HENT:  Head: Normocephalic and atraumatic.  Neck: Normal range of motion. Neck supple.  Cardiovascular: Normal rate and regular rhythm.  Exam reveals no gallop and no friction rub.   No murmur heard. Pulmonary/Chest: Effort normal and breath sounds normal. No respiratory distress. She has no wheezes.  Abdominal: Soft. Bowel sounds are normal. She exhibits no distension. There is no  tenderness.  Musculoskeletal: Normal range of motion.  Neurological: She is alert and oriented to person, place, and time. No cranial nerve deficit. She exhibits normal muscle tone. Coordination normal.  Skin: Skin is warm and dry. She is not diaphoretic.    ED Course  Procedures (including critical care time)  Labs Reviewed  CBC WITH DIFFERENTIAL  COMPREHENSIVE METABOLIC PANEL  TROPONIN I   No results found.   No diagnosis found.   Date: 11/26/2012  Rate: 79  Rhythm: normal sinus rhythm  QRS Axis: normal  Intervals: normal  ST/T Wave abnormalities: normal  Conduction Disutrbances:none  Narrative Interpretation:   Old EKG Reviewed: unchanged    MDM  The patient presents with tingling of the arms which I am unable to explain.  The labs and ekg are okay.  Doubt cardiac etiology and is not anatomically consistent with a stroke.  She appears well and has been ambulatory around the room.  Will discharge to home, return prn.          Geoffery Lyons, MD 11/26/12 [phone]

## 2012-11-26 NOTE — ED Notes (Signed)
Family called to pick pt up.

## 2012-11-26 NOTE — ED Notes (Signed)
You May call Daughter on Cell Phone [phone]

## 2013-03-11 ENCOUNTER — Encounter (HOSPITAL_COMMUNITY): Payer: Self-pay | Admitting: Emergency Medicine

## 2013-03-11 ENCOUNTER — Emergency Department (HOSPITAL_COMMUNITY): Payer: Medicare HMO

## 2013-03-11 ENCOUNTER — Emergency Department (HOSPITAL_COMMUNITY)
Admission: EM | Admit: 2013-03-11 | Discharge: 2013-03-12 | Disposition: A | Discharge status: Home / Self Care | Payer: Medicare HMO | Attending: Emergency Medicine | Admitting: Emergency Medicine

## 2013-03-11 DIAGNOSIS — Z8639 Personal history of other endocrine, nutritional and metabolic disease: Secondary | ICD-10-CM | POA: Insufficient documentation

## 2013-03-11 DIAGNOSIS — Y92009 Unspecified place in unspecified non-institutional (private) residence as the place of occurrence of the external cause: Secondary | ICD-10-CM | POA: Insufficient documentation

## 2013-03-11 DIAGNOSIS — S43036A Inferior dislocation of unspecified humerus, initial encounter: Secondary | ICD-10-CM | POA: Insufficient documentation

## 2013-03-11 DIAGNOSIS — Z87891 Personal history of nicotine dependence: Secondary | ICD-10-CM | POA: Insufficient documentation

## 2013-03-11 DIAGNOSIS — Z8679 Personal history of other diseases of the circulatory system: Secondary | ICD-10-CM | POA: Insufficient documentation

## 2013-03-11 DIAGNOSIS — Z79899 Other long term (current) drug therapy: Secondary | ICD-10-CM | POA: Insufficient documentation

## 2013-03-11 DIAGNOSIS — Y93E1 Activity, personal bathing and showering: Secondary | ICD-10-CM | POA: Insufficient documentation

## 2013-03-11 DIAGNOSIS — R000 Tachycardia, unspecified: Secondary | ICD-10-CM | POA: Insufficient documentation

## 2013-03-11 DIAGNOSIS — W07XXXA Fall from chair, initial encounter: Secondary | ICD-10-CM | POA: Insufficient documentation

## 2013-03-11 DIAGNOSIS — S42301A Unspecified fracture of shaft of humerus, right arm, initial encounter for closed fracture: Secondary | ICD-10-CM

## 2013-03-11 DIAGNOSIS — Z880 Allergy status to penicillin: Secondary | ICD-10-CM | POA: Insufficient documentation

## 2013-03-11 DIAGNOSIS — Z8781 Personal history of (healed) traumatic fracture: Secondary | ICD-10-CM | POA: Insufficient documentation

## 2013-03-11 DIAGNOSIS — Z8659 Personal history of other mental and behavioral disorders: Secondary | ICD-10-CM | POA: Insufficient documentation

## 2013-03-11 DIAGNOSIS — Z8739 Personal history of other diseases of the musculoskeletal system and connective tissue: Secondary | ICD-10-CM | POA: Insufficient documentation

## 2013-03-11 MED ORDER — OXYCODONE-ACETAMINOPHEN 5-325 MG PO TABS
1.0000 | ORAL_TABLET | Freq: Once | ORAL | Status: AC
  Administered 2013-03-11: 1 via ORAL
  Filled 2013-03-11: qty 1

## 2013-03-11 MED ORDER — SODIUM CHLORIDE 0.9 % IV SOLN
INTRAVENOUS | Status: DC
  Administered 2013-03-11 – 2013-03-12 (×2): via INTRAVENOUS

## 2013-03-11 MED ORDER — ETOMIDATE 2 MG/ML IV SOLN
INTRAVENOUS | Status: AC
  Administered 2013-03-11: 20 mg via INTRAVENOUS
  Filled 2013-03-11: qty 10

## 2013-03-11 MED ORDER — ETOMIDATE 2 MG/ML IV SOLN
0.3000 mg/kg | Freq: Once | INTRAVENOUS | Status: AC
  Administered 2013-03-11: 20 mg via INTRAVENOUS

## 2013-03-11 NOTE — ED Notes (Addendum)
PT. SLIPPED AND FELL AT HOME THIS EVENING , REPORTS PAIN AT RIGHT SHOULDER / RIGHT FINGERS SWELLING , NO LOC , DAUGHTER REPORTED PT. FELL LAST WEEK SEEN AT [facility] -  INJURED HER RIGHT SHOULDER - ARRANGED AN APPOINTMENT WITH AN ORTHOPEDIST.

## 2013-03-11 NOTE — ED Provider Notes (Addendum)
History     CSN: [id number]  Arrival date & time 03/11/13  2055   First MD Initiated Contact with Patient 03/11/13 2103      Chief Complaint  Patient presents with  . Fall    (Consider location/radiation/quality/duration/timing/severity/associated sxs/prior treatment) Patient is a 78 y.o. female presenting with fall. The history is provided by the patient and a relative.  Fall   patient fell today while getting out of a shower chair onto her right side. No head injury or loss of consciousness. No neck pain. Her weight fell onto her right shoulder and upper extremity. She currently has a right humerus fracture that is being treated with a sling. She notes increased pain at the prior entry site. Denies any trouble of arm pain. Denies any head pain but does note some swelling. Pain characterized as sharp and worse with movement.  Past Medical History  Diagnosis Date  . Vitamin D deficiency   . OA (osteoarthritis)   . Morbid obesity   . Fracture of rib of left side   . Aortic stenosis   . Dyspnea     DYSPNEA WITH EXERTION  . Constipation     OCCASIONAL CONSTIPATION  . Depression     Past Surgical History  Procedure Laterality Date  . Cataract surgery    . Tonsillectomy    . Ovary removed secondary to cust    . 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 Tonsillectomy      as child  . Joint replacement      L TOTAL KNEE 05/2011  . Dental surgery      TEETH EXTRACTED  . Total knee arthroplasty  08/24/2011    Procedure: TOTAL KNEE ARTHROPLASTY;  Surgeon: Shelda Pal;  Location: [facility];  Service: Orthopedics;  Laterality: Right;  with Femoral Nerve Block  . Femur im nail  07/16/2012    Procedure: INTRAMEDULLARY (IM) NAIL FEMORAL;  Surgeon: Shelda Pal, MD;  Location: [facility];  Service: Orthopedics;  Laterality: Left;    Family History  Problem Relation Age of Onset  . Cancer    . Diabetes      History  Substance Use Topics  . Smoking status: Former Smoker  -- 25 years    Quit date: 10/20/1979  . Smokeless tobacco: Not on file  . Alcohol Use: No    OB History   Grav Para Term Preterm Abortions TAB SAB Ect Mult Living                  Review of Systems  All other systems reviewed and are negative.    Allergies  Penicillins  Home Medications   Current Outpatient Rx  Name  Route  Sig  Dispense  Refill  . amLODipine (NORVASC) 5 MG tablet   Oral   Take 5 mg by mouth daily.         Marland Kitchen nystatin cream (MYCOSTATIN)   Topical   Apply topically 2 (two) times daily.           BP 134/81  Pulse 115  Temp(Src) 98.4 F (36.9 C) (Oral)  Resp 18  SpO2 96%  Physical Exam  Nursing note and vitals reviewed. Constitutional: She is oriented to person, place, and time. She appears well-developed and well-nourished.  Non-toxic appearance. No distress.  HENT:  Head: Normocephalic and atraumatic.  Eyes: Conjunctivae, EOM and lids are normal. Pupils are equal, round, and reactive to light.  Neck: Normal range of motion. Neck  supple. No tracheal deviation present. No mass present.  Cardiovascular: Regular rhythm and normal heart sounds.  Tachycardia present.  Exam reveals no gallop.   No murmur heard. Pulmonary/Chest: Effort normal and breath sounds normal. No stridor. No respiratory distress. She has no decreased breath sounds. She has no wheezes. She has no rhonchi. She has no rales.  Abdominal: Soft. Normal appearance and bowel sounds are normal. She exhibits no distension. There is no tenderness. There is no rebound and no CVA tenderness.  Musculoskeletal: Normal range of motion. She exhibits no edema and no tenderness.       Arms: Neurological: She is alert and oriented to person, place, and time. She has normal strength. No cranial nerve deficit or sensory deficit. GCS eye subscore is 4. GCS verbal subscore is 5. GCS motor subscore is 6.  Skin: Skin is warm and dry. No abrasion and no rash noted.  Psychiatric: She has a normal mood  and affect. Her speech is normal and behavior is normal.    ED Course  Reduction of dislocation Date/Time: 03/12/2013 12:10 AM Performed by: Toy Baker Authorized by: Lorre Nick T Consent: Verbal consent obtained. written consent obtained. Risks and benefits: risks, benefits and alternatives were discussed Consent given by: patient Patient understanding: patient states understanding of the procedure being performed Patient identity confirmed: verbally with patient Time out: Immediately prior to procedure a "time out" was called to verify the correct patient, procedure, equipment, support staff and site/side marked as required. Patient sedated: yes Sedation type: moderate (conscious) sedation Sedatives: etomidate Sedation start date/time: 03/12/2013 11:35 PM Sedation end date/time: 03/12/2013 11:58 PM Vitals: Vital signs were monitored during sedation. Patient tolerance: Patient tolerated the procedure well with no immediate complications.   (including critical care time)  Labs Reviewed - No data to display No results found.   No diagnosis found.    MDM   Post reduction x-rays pending at this time. Signed out to Dr. Patria Mane   12:16 AM Patient's post reduction x-ray still shows dislocation. She was seen at [facility] for an injury to that area. I suspect that she is chronically dislocated. Will obtain old records from [facility] and if current injuries new, will consult orthopedics. If not patient will be discharged home in a sling. Dr. Patria Mane to follow  12:20 AM Spoke with dr. Rennis Chris, requests to be called back if the shoulder injury is new, otherwise pt to f/u her orthopedist   Toy Baker, MD 03/12/13 0011  Toy Baker, MD 03/12/13 6213  Toy Baker, MD 03/12/13 [phone]

## 2013-03-11 NOTE — ED Notes (Signed)
Pt's daughter, Berton Mount, went to visit her daughter in peds. Harriet's cell number is [phone]

## 2013-03-12 ENCOUNTER — Emergency Department (HOSPITAL_COMMUNITY): Payer: Medicare HMO

## 2013-03-12 MED ORDER — HYDROCODONE-ACETAMINOPHEN 5-325 MG PO TABS: 1.0000 | ORAL_TABLET | ORAL | Status: DC | PRN

## 2013-03-12 MED ORDER — ETOMIDATE 2 MG/ML IV SOLN
0.1000 mg/kg | Freq: Once | INTRAVENOUS | Status: AC
  Administered 2013-03-12: 8 mg via INTRAVENOUS

## 2013-03-12 NOTE — ED Notes (Signed)
Dr. Patria Mane to repeat rt. humerus reduction. Pt. Verbalizes understanding.

## 2013-03-12 NOTE — ED Provider Notes (Signed)
Procedural sedation  (REPEAT Sedation- Second attempt) Performed by: Lyanne Co Consent: Verbal consent obtained. Risks and benefits: risks, benefits and alternatives were discussed Required items: required blood products, implants, devices, and special equipment available Patient identity confirmed: arm band and provided demographic data Time out: Immediately prior to procedure a "time out" was called to verify the correct patient, procedure, equipment, support staff and site/side marked as required. Sedation type: moderate (conscious) sedation NPO time confirmed and considedered Sedatives: ETOMIDATE Physician Time at Bedside: 15 Vitals: Vital signs were monitored during sedation. Cardiac Monitor, pulse oximeter Patient tolerance: Patient tolerated the procedure well with no immediate complications. Comments: Pt with uneventful recovered. Returned to pre-procedural sedation baseline   Patient continues to be dislocated after initial attempts.  Outside records demonstrate that the patient was successfully reduced at the outside hospital.  I performed a sedation while Dr. supple performed reduction.  Postreduction films pending at this time.  Dr. supple, orthopedics, states that if the patient is reduced she can be discharged home to followup with her orthopedic surgeon.  The patient has not successfully reduced she still can be followed up by her orthopedic surgeon however she will likely need open reduction in an operating room setting.  He states this does not need to be done tonight.   1:58 AM Post reduction demonstrates still dislocated. She will be discharged home still dislocated to follow up with her primary orthopedic surgeon.   Dg Shoulder Right  03/11/2013   *RADIOLOGY REPORT*  Clinical Data: Fall and pain  RIGHT SHOULDER - 2+ VIEW  Comparison: Humerus radiograph same date  Findings: Fracture dislocation of the right proximal humerus is redemonstrated with avulsion of the greater  tuberosity.  AC joint distance is normal.  Right lung apex clear.  Median sternotomy wires partly visualized.  IMPRESSION: Fracture dislocation of the right proximal humerus.  Findings called to Dr. Freida Busman by Dr. Chilton Si on 03/11/2013 at 11:05 p.m.   Original Report Authenticated By: Christiana Pellant, M.D.   Dg Shoulder Right Port  03/12/2013   *RADIOLOGY REPORT*  Clinical Data: Attempted relocation  PORTABLE RIGHT SHOULDER - 2+ VIEW  Comparison: Films same date  Findings: There is persistent fracture dislocation of the proximal right humerus.  No significant change.  IMPRESSION: Persistent fracture dislocation of the proximal right humerus.   Original Report Authenticated By: Christiana Pellant, M.D.   Dg Humerus Right  03/11/2013   *RADIOLOGY REPORT*  Clinical Data: Fall and pain  RIGHT HUMERUS - 2+ VIEW  Comparison: Chest radiograph 11/26/2012.  No previous exam dedicated to the shoulder.  Findings: Fracture dislocation of the right proximal humerus is identified.  Avulsion of the greater tuberosity is noted.  Right lung apex is clear.  AC joint distance is normal.  IMPRESSION: Right proximal humeral fracture dislocation.  Findings called to Dr. Freida Busman by Dr. Chilton Si on 03/11/2013 at 11:05 p.m.   Original Report Authenticated By: Christiana Pellant, M.D.  I personally reviewed the imaging tests through PACS system I reviewed available ER/hospitalization records through the EMR   Lyanne Co, MD 03/12/13 [phone]

## 2013-03-12 NOTE — Sedation Documentation (Signed)
Medication dose calculated and verified for 6 mg

## 2013-03-12 NOTE — ED Notes (Signed)
Family updated as to patient's status.

## 2013-03-30 ENCOUNTER — Other Ambulatory Visit: Payer: Self-pay | Admitting: Orthopedic Surgery

## 2013-04-03 NOTE — Progress Notes (Signed)
Pre-op instructions were left on pt answering machine # [phone].

## 2013-04-04 ENCOUNTER — Inpatient Hospital Stay (HOSPITAL_COMMUNITY): Payer: Medicare HMO

## 2013-04-04 ENCOUNTER — Encounter (HOSPITAL_COMMUNITY)
Admission: RE | Disposition: A | Discharge status: Skilled Nursing Facility | Payer: Self-pay | Source: Ambulatory Visit | Attending: Orthopedic Surgery

## 2013-04-04 ENCOUNTER — Encounter (HOSPITAL_COMMUNITY): Payer: Self-pay | Admitting: Certified Registered Nurse Anesthetist

## 2013-04-04 ENCOUNTER — Inpatient Hospital Stay: Admit: 2013-04-04 | Payer: Self-pay | Admitting: Orthopedic Surgery

## 2013-04-04 ENCOUNTER — Inpatient Hospital Stay (HOSPITAL_COMMUNITY): Payer: Medicare HMO | Admitting: Certified Registered Nurse Anesthetist

## 2013-04-04 ENCOUNTER — Encounter (HOSPITAL_COMMUNITY): Payer: Self-pay | Admitting: *Deleted

## 2013-04-04 ENCOUNTER — Inpatient Hospital Stay (HOSPITAL_COMMUNITY)
Admission: RE | Admit: 2013-04-04 | Discharge: 2013-04-06 | DRG: 483 | Disposition: A | Discharge status: Skilled Nursing Facility | Payer: Medicare HMO | Source: Ambulatory Visit | Attending: Orthopedic Surgery | Admitting: Orthopedic Surgery

## 2013-04-04 DIAGNOSIS — D62 Acute posthemorrhagic anemia: Secondary | ICD-10-CM | POA: Diagnosis not present

## 2013-04-04 DIAGNOSIS — Z87891 Personal history of nicotine dependence: Secondary | ICD-10-CM

## 2013-04-04 DIAGNOSIS — L408 Other psoriasis: Secondary | ICD-10-CM | POA: Diagnosis present

## 2013-04-04 DIAGNOSIS — W010XXA Fall on same level from slipping, tripping and stumbling without subsequent striking against object, initial encounter: Secondary | ICD-10-CM | POA: Diagnosis present

## 2013-04-04 DIAGNOSIS — E559 Vitamin D deficiency, unspecified: Secondary | ICD-10-CM | POA: Diagnosis present

## 2013-04-04 DIAGNOSIS — I359 Nonrheumatic aortic valve disorder, unspecified: Secondary | ICD-10-CM | POA: Diagnosis present

## 2013-04-04 DIAGNOSIS — Z6835 Body mass index (BMI) 35.0-35.9, adult: Secondary | ICD-10-CM

## 2013-04-04 DIAGNOSIS — Y92009 Unspecified place in unspecified non-institutional (private) residence as the place of occurrence of the external cause: Secondary | ICD-10-CM

## 2013-04-04 DIAGNOSIS — I10 Essential (primary) hypertension: Secondary | ICD-10-CM | POA: Diagnosis present

## 2013-04-04 DIAGNOSIS — Z79899 Other long term (current) drug therapy: Secondary | ICD-10-CM

## 2013-04-04 DIAGNOSIS — S42293A Other displaced fracture of upper end of unspecified humerus, initial encounter for closed fracture: Principal | ICD-10-CM | POA: Diagnosis present

## 2013-04-04 DIAGNOSIS — S4290XA Fracture of unspecified shoulder girdle, part unspecified, initial encounter for closed fracture: Secondary | ICD-10-CM

## 2013-04-04 DIAGNOSIS — Z9849 Cataract extraction status, unspecified eye: Secondary | ICD-10-CM

## 2013-04-04 DIAGNOSIS — G4730 Sleep apnea, unspecified: Secondary | ICD-10-CM | POA: Diagnosis present

## 2013-04-04 DIAGNOSIS — Z96659 Presence of unspecified artificial knee joint: Secondary | ICD-10-CM

## 2013-04-04 DIAGNOSIS — M1990 Unspecified osteoarthritis, unspecified site: Secondary | ICD-10-CM | POA: Diagnosis present

## 2013-04-04 DIAGNOSIS — F329 Major depressive disorder, single episode, unspecified: Secondary | ICD-10-CM | POA: Diagnosis present

## 2013-04-04 DIAGNOSIS — L259 Unspecified contact dermatitis, unspecified cause: Secondary | ICD-10-CM | POA: Diagnosis present

## 2013-04-04 DIAGNOSIS — F3289 Other specified depressive episodes: Secondary | ICD-10-CM | POA: Diagnosis present

## 2013-04-04 DIAGNOSIS — Y93E1 Activity, personal bathing and showering: Secondary | ICD-10-CM

## 2013-04-04 DIAGNOSIS — S4291XD Fracture of right shoulder girdle, part unspecified, subsequent encounter for fracture with routine healing: Secondary | ICD-10-CM

## 2013-04-04 HISTORY — PX: SHOULDER CLOSED REDUCTION: SHX1051

## 2013-04-04 HISTORY — DX: Essential (primary) hypertension: I10

## 2013-04-04 HISTORY — DX: Effusion, unspecified joint: M25.40

## 2013-04-04 HISTORY — DX: Gastric ulcer, unspecified as acute or chronic, without hemorrhage or perforation: K25.9

## 2013-04-04 HISTORY — DX: Dermatitis, unspecified: L30.9

## 2013-04-04 HISTORY — DX: Urgency of urination: R39.15

## 2013-04-04 HISTORY — DX: Unspecified urinary incontinence: R32

## 2013-04-04 HISTORY — DX: Sleep apnea, unspecified: G47.30

## 2013-04-04 HISTORY — DX: Nocturia: R35.1

## 2013-04-04 HISTORY — DX: Frequency of micturition: R35.0

## 2013-04-04 HISTORY — PX: REVERSE SHOULDER ARTHROPLASTY: SHX5054

## 2013-04-04 HISTORY — DX: Pain in unspecified joint: M25.50

## 2013-04-04 HISTORY — DX: Psoriasis, unspecified: L40.9

## 2013-04-04 LAB — CBC WITH DIFFERENTIAL/PLATELET
Eosinophils Absolute: 0.2 10*3/uL (ref 0.0–0.7)
Eosinophils Relative: 4 % (ref 0–5)
HCT: 40.5 % (ref 36.0–46.0)
Hemoglobin: 13.5 g/dL (ref 12.0–15.0)
Lymphs Abs: 0.8 10*3/uL (ref 0.7–4.0)
MCH: 30.4 pg (ref 26.0–34.0)
MCV: 91.2 fL (ref 78.0–100.0)
Monocytes Absolute: 0.6 10*3/uL (ref 0.1–1.0)
Monocytes Relative: 10 % (ref 3–12)
RBC: 4.44 MIL/uL (ref 3.87–5.11)

## 2013-04-04 LAB — PROTIME-INR
INR: 0.99 (ref 0.00–1.49)
Prothrombin Time: 13 seconds (ref 11.6–15.2)

## 2013-04-04 LAB — APTT: aPTT: 29 seconds (ref 24–37)

## 2013-04-04 LAB — COMPREHENSIVE METABOLIC PANEL
Alkaline Phosphatase: 117 U/L (ref 39–117)
BUN: 13 mg/dL (ref 6–23)
Calcium: 8.9 mg/dL (ref 8.4–10.5)
Creatinine, Ser: 0.76 mg/dL (ref 0.50–1.10)
GFR calc Af Amer: >90 mL/min (ref >90)
Glucose, Bld: 93 mg/dL (ref 70–99)
Potassium: 4.3 mEq/L (ref 3.5–5.1)
Total Protein: 7.4 g/dL (ref 6.0–8.3)

## 2013-04-04 LAB — SURGICAL PCR SCREEN: MRSA, PCR: NEGATIVE

## 2013-04-04 SURGERY — CLOSED REDUCTION, SHOULDER
Anesthesia: Choice | Site: Shoulder | Laterality: Right

## 2013-04-04 SURGERY — CLOSED REDUCTION, SHOULDER
Anesthesia: Regional | Site: Shoulder | Laterality: Right | Wound class: Clean

## 2013-04-04 MED ORDER — ROPIVACAINE HCL 5 MG/ML IJ SOLN
INTRAMUSCULAR | Status: DC | PRN
  Administered 2013-04-04: 25 mL via EPIDURAL

## 2013-04-04 MED ORDER — SODIUM CHLORIDE 0.9 % IV SOLN: INTRAVENOUS | Status: DC

## 2013-04-04 MED ORDER — ASPIRIN EC 325 MG PO TBEC
325.0000 mg | DELAYED_RELEASE_TABLET | Freq: Two times a day (BID) | ORAL | Status: DC
  Administered 2013-04-04 – 2013-04-06 (×4): 325 mg via ORAL
  Filled 2013-04-04 (×6): qty 1

## 2013-04-04 MED ORDER — ALBUMIN HUMAN 5 % IV SOLN
INTRAVENOUS | Status: DC | PRN
  Administered 2013-04-04: 13:00:00 via INTRAVENOUS

## 2013-04-04 MED ORDER — PROPOFOL 10 MG/ML IV BOLUS
INTRAVENOUS | Status: DC | PRN
  Administered 2013-04-04: 100 mg via INTRAVENOUS

## 2013-04-04 MED ORDER — METOCLOPRAMIDE HCL 5 MG/ML IJ SOLN: 5.0000 mg | Freq: Three times a day (TID) | INTRAMUSCULAR | Status: DC | PRN

## 2013-04-04 MED ORDER — CEFAZOLIN SODIUM-DEXTROSE 2-3 GM-% IV SOLR
INTRAVENOUS | Status: AC
  Filled 2013-04-04: qty 50

## 2013-04-04 MED ORDER — CEFAZOLIN SODIUM-DEXTROSE 2-3 GM-% IV SOLR
2.0000 g | Freq: Once | INTRAVENOUS | Status: AC
  Administered 2013-04-04: 2 g via INTRAVENOUS

## 2013-04-04 MED ORDER — FLEET ENEMA 7-19 GM/118ML RE ENEM: 1.0000 | ENEMA | Freq: Once | RECTAL | Status: AC | PRN

## 2013-04-04 MED ORDER — DIPHENHYDRAMINE HCL 12.5 MG/5ML PO ELIX: 12.5000 mg | ORAL_SOLUTION | ORAL | Status: DC | PRN

## 2013-04-04 MED ORDER — ACETAMINOPHEN 650 MG RE SUPP: 650.0000 mg | Freq: Four times a day (QID) | RECTAL | Status: DC | PRN

## 2013-04-04 MED ORDER — SERTRALINE HCL 50 MG PO TABS
50.0000 mg | ORAL_TABLET | Freq: Every day | ORAL | Status: DC
  Administered 2013-04-04 – 2013-04-06 (×3): 50 mg via ORAL
  Filled 2013-04-04 (×3): qty 1

## 2013-04-04 MED ORDER — DOCUSATE SODIUM 100 MG PO CAPS
100.0000 mg | ORAL_CAPSULE | Freq: Two times a day (BID) | ORAL | Status: DC
  Administered 2013-04-04 – 2013-04-06 (×4): 100 mg via ORAL
  Filled 2013-04-04 (×4): qty 1

## 2013-04-04 MED ORDER — BISACODYL 5 MG PO TBEC
5.0000 mg | DELAYED_RELEASE_TABLET | Freq: Every day | ORAL | Status: DC | PRN
  Administered 2013-04-04 – 2013-04-05 (×2): 5 mg via ORAL
  Filled 2013-04-04 (×2): qty 1

## 2013-04-04 MED ORDER — MUPIROCIN 2 % EX OINT
TOPICAL_OINTMENT | CUTANEOUS | Status: AC
  Administered 2013-04-04: 1
  Filled 2013-04-04: qty 22

## 2013-04-04 MED ORDER — ONDANSETRON HCL 4 MG/2ML IJ SOLN: 4.0000 mg | Freq: Four times a day (QID) | INTRAMUSCULAR | Status: DC | PRN

## 2013-04-04 MED ORDER — PHENOL 1.4 % MT LIQD: 1.0000 | OROMUCOSAL | Status: DC | PRN

## 2013-04-04 MED ORDER — ONDANSETRON HCL 4 MG PO TABS: 4.0000 mg | ORAL_TABLET | Freq: Four times a day (QID) | ORAL | Status: DC | PRN

## 2013-04-04 MED ORDER — ACETAMINOPHEN 325 MG PO TABS
650.0000 mg | ORAL_TABLET | Freq: Four times a day (QID) | ORAL | Status: DC | PRN
  Administered 2013-04-04: 650 mg via ORAL

## 2013-04-04 MED ORDER — OXYCODONE-ACETAMINOPHEN 5-325 MG PO TABS
1.0000 | ORAL_TABLET | ORAL | Status: DC | PRN
  Administered 2013-04-04 – 2013-04-05 (×2): 2 via ORAL
  Filled 2013-04-04 (×2): qty 2

## 2013-04-04 MED ORDER — ZOLPIDEM TARTRATE 5 MG PO TABS: 5.0000 mg | ORAL_TABLET | Freq: Every evening | ORAL | Status: DC | PRN

## 2013-04-04 MED ORDER — GLYCOPYRROLATE 0.2 MG/ML IJ SOLN
INTRAMUSCULAR | Status: DC | PRN
  Administered 2013-04-04: 0.4 mg via INTRAVENOUS

## 2013-04-04 MED ORDER — LACTATED RINGERS IV SOLN
INTRAVENOUS | Status: DC
  Administered 2013-04-04 (×2): via INTRAVENOUS

## 2013-04-04 MED ORDER — MORPHINE SULFATE 2 MG/ML IJ SOLN
1.0000 mg | INTRAMUSCULAR | Status: DC | PRN
  Administered 2013-04-04: 1 mg via INTRAVENOUS
  Filled 2013-04-04: qty 1

## 2013-04-04 MED ORDER — ARTIFICIAL TEARS OP OINT
TOPICAL_OINTMENT | OPHTHALMIC | Status: DC | PRN
  Administered 2013-04-04: 1 via OPHTHALMIC

## 2013-04-04 MED ORDER — PHENYLEPHRINE HCL 10 MG/ML IJ SOLN
INTRAMUSCULAR | Status: DC | PRN
  Administered 2013-04-04 (×2): 40 ug via INTRAVENOUS

## 2013-04-04 MED ORDER — 0.9 % SODIUM CHLORIDE (POUR BTL) OPTIME
TOPICAL | Status: DC | PRN
  Administered 2013-04-04: 1000 mL

## 2013-04-04 MED ORDER — POLYETHYLENE GLYCOL 3350 17 G PO PACK: 17.0000 g | PACK | Freq: Every day | ORAL | Status: DC | PRN

## 2013-04-04 MED ORDER — FENTANYL CITRATE 0.05 MG/ML IJ SOLN
INTRAMUSCULAR | Status: AC
  Administered 2013-04-04: 100 ug
  Filled 2013-04-04: qty 2

## 2013-04-04 MED ORDER — LIDOCAINE HCL (CARDIAC) 20 MG/ML IV SOLN
INTRAVENOUS | Status: DC | PRN
  Administered 2013-04-04: 20 mg via INTRAVENOUS

## 2013-04-04 MED ORDER — AMLODIPINE BESYLATE 10 MG PO TABS
10.0000 mg | ORAL_TABLET | Freq: Every day | ORAL | Status: DC
  Administered 2013-04-04 – 2013-04-06 (×3): 10 mg via ORAL
  Filled 2013-04-04 (×3): qty 1

## 2013-04-04 MED ORDER — NEOSTIGMINE METHYLSULFATE 1 MG/ML IJ SOLN
INTRAMUSCULAR | Status: DC | PRN
  Administered 2013-04-04: 3 mg via INTRAVENOUS

## 2013-04-04 MED ORDER — SODIUM CHLORIDE 0.9 % IV SOLN
10.0000 mg | INTRAVENOUS | Status: DC | PRN
  Administered 2013-04-04: 50 ug/min via INTRAVENOUS

## 2013-04-04 MED ORDER — HYDROCODONE-ACETAMINOPHEN 5-325 MG PO TABS: 1.0000 | ORAL_TABLET | ORAL | Status: DC | PRN

## 2013-04-04 MED ORDER — OXYCODONE HCL 5 MG PO TABS
5.0000 mg | ORAL_TABLET | ORAL | Status: DC | PRN
  Administered 2013-04-04 – 2013-04-06 (×3): 10 mg via ORAL
  Filled 2013-04-04 (×3): qty 2

## 2013-04-04 MED ORDER — ONDANSETRON HCL 4 MG/2ML IJ SOLN
INTRAMUSCULAR | Status: DC | PRN
  Administered 2013-04-04: 4 mg via INTRAVENOUS

## 2013-04-04 MED ORDER — CEFAZOLIN SODIUM-DEXTROSE 2-3 GM-% IV SOLR
2.0000 g | Freq: Four times a day (QID) | INTRAVENOUS | Status: AC
  Administered 2013-04-04 – 2013-04-05 (×3): 2 g via INTRAVENOUS
  Filled 2013-04-04 (×3): qty 50

## 2013-04-04 MED ORDER — HYDROMORPHONE HCL PF 1 MG/ML IJ SOLN: 0.2500 mg | INTRAMUSCULAR | Status: DC | PRN

## 2013-04-04 MED ORDER — ROCURONIUM BROMIDE 100 MG/10ML IV SOLN
INTRAVENOUS | Status: DC | PRN
  Administered 2013-04-04: 30 mg via INTRAVENOUS

## 2013-04-04 MED ORDER — ALUMINUM HYDROXIDE GEL 320 MG/5ML PO SUSP
15.0000 mL | ORAL | Status: DC | PRN
  Filled 2013-04-04: qty 30

## 2013-04-04 MED ORDER — MENTHOL 3 MG MT LOZG: 1.0000 | LOZENGE | OROMUCOSAL | Status: DC | PRN

## 2013-04-04 MED ORDER — METOCLOPRAMIDE HCL 10 MG PO TABS: 5.0000 mg | ORAL_TABLET | Freq: Three times a day (TID) | ORAL | Status: DC | PRN

## 2013-04-04 SURGICAL SUPPLY — 87 items
BIT DRILL 170X2.5X (BIT) IMPLANT
BIT DRL 170X2.5X (BIT)
BLADE SAW SAG 73X25 THK (BLADE) ×1
BLADE SAW SGTL 73X25 THK (BLADE) ×1 IMPLANT
BOWL SMART MIX CTS (DISPOSABLE) IMPLANT
CAPT SHOULD DELTAXTEND CEM MOD ×2 IMPLANT
CEMENT BONE DEPUY (Cement) ×4 IMPLANT
CHLORAPREP W/TINT 26ML (MISCELLANEOUS) ×2 IMPLANT
CLOTH BEACON ORANGE TIMEOUT ST (SAFETY) ×2 IMPLANT
CLSR STERI-STRIP ANTIMIC 1/2X4 (GAUZE/BANDAGES/DRESSINGS) ×2 IMPLANT
COVER SURGICAL LIGHT HANDLE (MISCELLANEOUS) ×4 IMPLANT
DRAPE C-ARM 42X72 X-RAY (DRAPES) ×2 IMPLANT
DRAPE INCISE IOBAN 66X45 STRL (DRAPES) ×2 IMPLANT
DRAPE PROXIMA HALF (DRAPES) ×2 IMPLANT
DRAPE SURG 17X23 STRL (DRAPES) ×2 IMPLANT
DRAPE U-SHAPE 47X51 STRL (DRAPES) ×2 IMPLANT
DRILL 2.5 (BIT)
DRSG ADAPTIC 3X8 NADH LF (GAUZE/BANDAGES/DRESSINGS) ×2 IMPLANT
DRSG EMULSION OIL 3X3 NADH (GAUZE/BANDAGES/DRESSINGS) ×2 IMPLANT
DRSG MEPILEX BORDER 4X4 (GAUZE/BANDAGES/DRESSINGS) ×2 IMPLANT
DRSG MEPILEX BORDER 4X8 (GAUZE/BANDAGES/DRESSINGS) ×2 IMPLANT
DRSG PAD ABDOMINAL 8X10 ST (GAUZE/BANDAGES/DRESSINGS) ×4 IMPLANT
ELECT BLADE 4.0 EZ CLEAN MEGAD (MISCELLANEOUS)
ELECT REM PT RETURN 9FT ADLT (ELECTROSURGICAL) ×2
ELECTRODE BLDE 4.0 EZ CLN MEGD (MISCELLANEOUS) IMPLANT
ELECTRODE REM PT RTRN 9FT ADLT (ELECTROSURGICAL) ×1 IMPLANT
EVACUATOR 1/8 PVC DRAIN (DRAIN) ×2 IMPLANT
GLOVE BIO SURGEON STRL SZ7 (GLOVE) ×4 IMPLANT
GLOVE BIO SURGEON STRL SZ7.5 (GLOVE) ×2 IMPLANT
GLOVE BIOGEL PI IND STRL 7.0 (GLOVE) ×1 IMPLANT
GLOVE BIOGEL PI IND STRL 8 (GLOVE) ×1 IMPLANT
GLOVE BIOGEL PI INDICATOR 7.0 (GLOVE) ×1
GLOVE BIOGEL PI INDICATOR 8 (GLOVE) ×1
GOWN PREVENTION PLUS LG XLONG (DISPOSABLE) ×2 IMPLANT
GOWN PREVENTION PLUS XLARGE (GOWN DISPOSABLE) ×2 IMPLANT
GOWN STRL NON-REIN LRG LVL3 (GOWN DISPOSABLE) ×4 IMPLANT
HANDPIECE INTERPULSE COAX TIP (DISPOSABLE)
HEMOSTAT SURGICEL 2X14 (HEMOSTASIS) IMPLANT
HOOD PEEL AWAY FACE SHEILD DIS (HOOD) ×4 IMPLANT
KIT BASIN OR (CUSTOM PROCEDURE TRAY) ×2 IMPLANT
KIT ROOM TURNOVER OR (KITS) ×2 IMPLANT
MANIFOLD NEPTUNE II (INSTRUMENTS) ×2 IMPLANT
NDL SUT 2 .5 CRC MAYO 1.732X (NEEDLE) ×1 IMPLANT
NDL SUT 6 .5 CRC .975X.05 MAYO (NEEDLE) ×1 IMPLANT
NEEDLE 22X1 1/2 (OR ONLY) (NEEDLE) IMPLANT
NEEDLE HYPO 25GX1X1/2 BEV (NEEDLE) ×2 IMPLANT
NEEDLE MAYO TAPER (NEEDLE) ×2
NEEDLE MAYO TROCAR (NEEDLE) ×2 IMPLANT
NOZZLE PRISM 8.5MM (MISCELLANEOUS) IMPLANT
NS IRRIG 1000ML POUR BTL (IV SOLUTION) ×2 IMPLANT
PACK SHOULDER (CUSTOM PROCEDURE TRAY) ×2 IMPLANT
PAD ARMBOARD 7.5X6 YLW CONV (MISCELLANEOUS) ×4 IMPLANT
PIN GUIDE 1.2 (PIN) IMPLANT
PIN GUIDE GLENOPHERE 1.5MX300M (PIN) IMPLANT
PIN METAGLENE 2.5 (PIN) IMPLANT
RETRIEVER SUT HEWSON (MISCELLANEOUS) IMPLANT
SET HNDPC FAN SPRY TIP SCT (DISPOSABLE) IMPLANT
SLING ARM IMMOBILIZER LRG (SOFTGOODS) ×2 IMPLANT
SLING ARM IMMOBILIZER MED (SOFTGOODS) IMPLANT
SPONGE GAUZE 4X4 12PLY (GAUZE/BANDAGES/DRESSINGS) ×2 IMPLANT
SPONGE LAP 18X18 X RAY DECT (DISPOSABLE) ×4 IMPLANT
SPONGE LAP 4X18 X RAY DECT (DISPOSABLE) ×4 IMPLANT
STAPLER VISISTAT 35W (STAPLE) ×2 IMPLANT
STRIP CLOSURE SKIN 1/2X4 (GAUZE/BANDAGES/DRESSINGS) ×2 IMPLANT
SUCTION FRAZIER TIP 10 FR DISP (SUCTIONS) ×2 IMPLANT
SUPPORT WRAP ARM LG (MISCELLANEOUS) ×2 IMPLANT
SUT BONE WAX W31G (SUTURE) IMPLANT
SUT ETHIBOND 2 OS 4 DA (SUTURE) ×4 IMPLANT
SUT ETHIBOND NAB CT1 #1 30IN (SUTURE) ×4 IMPLANT
SUT FIBERWIRE #2 38 T-5 BLUE (SUTURE) ×4
SUT MNCRL AB 4-0 PS2 18 (SUTURE) ×2 IMPLANT
SUT SILK 2 0 TIES 17X18 (SUTURE) ×1
SUT SILK 2-0 18XBRD TIE BLK (SUTURE) ×1 IMPLANT
SUT VIC AB 0 CT1 27 (SUTURE) ×1
SUT VIC AB 0 CT1 27XBRD ANBCTR (SUTURE) ×1 IMPLANT
SUT VIC AB 0 CTB1 27 (SUTURE) ×2 IMPLANT
SUT VIC AB 2-0 CT1 27 (SUTURE) ×2
SUT VIC AB 2-0 CT1 TAPERPNT 27 (SUTURE) ×2 IMPLANT
SUT VICRYL 4-0 PS2 18IN ABS (SUTURE) ×2 IMPLANT
SUTURE FIBERWR #2 38 T-5 BLUE (SUTURE) ×2 IMPLANT
SYR CONTROL 10ML LL (SYRINGE) ×2 IMPLANT
SYRINGE TOOMEY DISP (SYRINGE) IMPLANT
TOWEL OR 17X24 6PK STRL BLUE (TOWEL DISPOSABLE) ×2 IMPLANT
TOWEL OR 17X26 10 PK STRL BLUE (TOWEL DISPOSABLE) ×2 IMPLANT
TRAY FOLEY CATH 14FR (SET/KITS/TRAYS/PACK) IMPLANT
WATER STERILE IRR 1000ML POUR (IV SOLUTION) ×2 IMPLANT
YANKAUER SUCT BULB TIP NO VENT (SUCTIONS) ×2 IMPLANT

## 2013-04-04 NOTE — Progress Notes (Addendum)
Dr.Crenshaw is cardiologist with last visit about a yr ago;sees as needed  Echo reports in epic from 2011 and 2012  Heart cath in epic from 2012  Medical Md is Dr.Erin Wallace Cullens on [address]  EKG and CXR in epic from 11/26/12

## 2013-04-04 NOTE — Op Note (Signed)
Procedure(s): RIGHT SHOULDER CLOSED REDUCTION  VERSES REVERSE TOTAL SHOULDER REPLACEMENT  Procedure Note  [name] female 78 y.o. 04/04/2013  Procedure(s) and Anesthesia Type:  #1 right shoulder open reduction of fracture dislocation #2 right shoulder reverse total shoulder replacement  Postoperative diagnosis: Right shoulder chronic fracture dislocation  Indications:  78 y.o. female  With right shoulder fracture dislocation which has been dislocated now for about 4 weeks. Complaining continued pain and dysfunction was indicated for operative treatment to decrease pain and attempt to to increase her function. She understood potential risks benefits and alternatives to surgery. We talked about first trying a closed reduction but with the chronicity of her dislocation it would be likely irreducible. We also talked about the possibility of an open reduction with internal fixation versus a reverse total shoulder replacement to restore stability.      Surgeon: Mable Paris   Assistants: Damita Lack PA-C [facility] was present and scrubbed throughout the procedure and was essential in positioning, retraction, exposure, and closure)  Anesthesia: General endotracheal anesthesia with preoperative regional block given by the attending anesthesiologist    Procedure Detail   Estimated Blood Loss:  700 mL         Drains: 1 medium hemovac  Blood Given: none          Specimens: none        Complications:  * No complications entered in OR log *         Disposition: PACU - hemodynamically stable.         Condition: stable      OPERATIVE FINDINGS:  A DePuy  cemented reverse total shoulder arthroplasty was placed with a  size 10 stem, a 38 eccentric glenosphere, and a +6-mm poly insert. The base plate  fixation was excellent.  PROCEDURE: The patient was identified in the preoperative holding area  where I personally marked the operative site after verifying  site, side,  and procedure with the patient. An interscalene block given by  the attending anesthesiologist in the holding area and the patient was taken back to the operating room where all extremities were  carefully padded in position after general anesthesia was induced.  She was initially kept in the supine position and an attempt was made at closed reduction. Fluoroscopic imaging showed no movement with multiple reduction maneuvers and traction under anesthesia. Therefore the decision was made to proceed with open reduction. She was placed in the beachchair position with all extremities carefully padded and positioned. The right upper extremity was prepped and draped in standard sterile fashion. Initially a limited approach was made with about a 5 cm incision. Dissection was carried down to the cephalic vein identifying the deltopectoral interval. This interval was exploited. There is a significant amount of scar tissue. I was able to find the conjoined tendon and a retractor was placed beneath the conjoined tendon. The biceps tendon was identified and exposed. The joint was opened through this interval. Through manipulation and with the assistance of Cobb elevators was still not able to reduce the joint. Therefore the exposure was extended proximally and distally for a total of about 12 cm. The rotator interval was completely opened and once it was determined that the shoulder would not stay in place despite attempted reductions I felt that a reverse total shoulder replacement was indicated. Therefore the subscapularis was taken down with gentle external rotation. The humeral head was dislocated and prepared for reverse total shoulder replacement. Humeral head cut was made and  the trial implant was placed. The humeral head was then retracted posteriorly and inferiorly using a Fukuda retractor and the glenoid was exposed. The glenoid was quite small but seemed to be intact. Anterior posterior capsular  releases were carried out and the labrum was excised circumferentially. The guide was then used to place the pin in the glenoid was reamed to concentric surface. The central hole was drilled in the fundus sphere was then impacted with an excellent press-fit. 2 locking and 2 nonlocking screws were placed with good fixation. The 38 eccentric glenoid sphere was then impacted and tightened into position. The proximal humerus was then again exposed. The greater tuberosity was noted to be fractured and absent. The implant could not be press-fit. The size 10 stem was cemented into place with a hybrid cement technique. After the cement was hardened and dry the +6 trial was placed with excellent stability. Therefore the final +6 poly-component was placed. The joint was reduced. It could be taken through a gentle range of motion without any instability. The greater tuberosity naturally reduced fairly closely to its anatomic position. No attempt was made to try and reattach this. The subscapularis could not be repaired. The joint was copiously irrigated with normal saline and an deep drain was placed. The wound was then closed in layers and a sterile dressing was applied. The patient was allowed to awaken from anesthesia transferred to the stretcher and taken to the recovery room in stable condition.  Postoperative plan: She will be kept overnight for observation and pain control. We will check a hemoglobin in the morning. There was a fair amount of blood loss during the case and she may require transfusion.

## 2013-04-04 NOTE — Anesthesia Postprocedure Evaluation (Signed)
  Anesthesia Post-op Note  Patient: [name]  Procedure(s) Performed: Procedure(s): RIGHT SHOULDER CLOSED REDUCTION  (Right) VERSES REVERSE TOTAL SHOULDER REPLACEMENT  (Right)  Patient Location: PACU  Anesthesia Type:GA combined with regional for post-op pain  Level of Consciousness: awake  Airway and Oxygen Therapy: Patient Spontanous Breathing and Patient connected to nasal cannula oxygen  Post-op Pain: none  Post-op Assessment: Post-op Vital signs reviewed, Patient's Cardiovascular Status Stable, Respiratory Function Stable, Patent Airway and No signs of Nausea or vomiting  Post-op Vital Signs: Reviewed and stable  Complications: No apparent anesthesia complications

## 2013-04-04 NOTE — Anesthesia Preprocedure Evaluation (Signed)
Anesthesia Evaluation  Patient identified by MRN, date of birth, ID band Patient awake    Reviewed: Allergy & Precautions, H&P , NPO status , Patient's Chart, lab work & pertinent test results  Airway Mallampati: II TM Distance: >3 FB Neck ROM: Full    Dental no notable dental hx. (+) Edentulous Upper, Edentulous Lower and Dental Advisory Given   Pulmonary neg pulmonary ROS, neg shortness of breath, neg sleep apnea,  breath sounds clear to auscultation  Pulmonary exam normal       Cardiovascular hypertension, On Medications + Valvular Problems/Murmurs Rhythm:Regular Rate:Normal  S/P Aortic valve replacement   Neuro/Psych PSYCHIATRIC DISORDERS negative neurological ROS     GI/Hepatic negative GI ROS, Neg liver ROS,   Endo/Other  negative endocrine ROS  Renal/GU negative Renal ROS  negative genitourinary   Musculoskeletal   Abdominal   Peds  Hematology negative hematology ROS (+)   Anesthesia Other Findings   Reproductive/Obstetrics negative OB ROS                           Anesthesia Physical Anesthesia Plan  ASA: III  Anesthesia Plan: General and Regional   Post-op Pain Management:    Induction: Intravenous  Airway Management Planned: Oral ETT  Additional Equipment:   Intra-op Plan:   Post-operative Plan: Extubation in OR  Informed Consent: I have reviewed the patients History and Physical, chart, labs and discussed the procedure including the risks, benefits and alternatives for the proposed anesthesia with the patient or authorized representative who has indicated his/her understanding and acceptance.   Dental advisory given  Plan Discussed with: CRNA and Surgeon  Anesthesia Plan Comments:         Anesthesia Quick Evaluation

## 2013-04-04 NOTE — Anesthesia Procedure Notes (Addendum)
Anesthesia Regional Block:  Interscalene brachial plexus block  Pre-Anesthetic Checklist: ,, timeout performed, Correct Patient, Correct Site, Correct Laterality, Correct Procedure, Correct Position, site marked, Risks and benefits discussed, pre-op evaluation,  At surgeon's request and post-op pain management  Laterality: Right  Prep: Maximum Sterile Barrier Precautions used and chloraprep       Needles:  Injection technique: Single-shot  Needle Type: Echogenic Stimulator Needle     Needle Length: 5cm 5 cm Needle Gauge: 22 and 22 G    Additional Needles:  Procedures: ultrasound guided (picture in chart) and nerve stimulator Interscalene brachial plexus block  Nerve Stimulator or Paresthesia:  Response: Biceps response, 0.4 mA,   Additional Responses:   Narrative:  Start time: 04/04/2013 10:15 AM End time: 04/04/2013 10:30 AM Injection made incrementally with aspirations every 5 mL. Anesthesiologist: Sampson Goon, MD  Additional Notes: 2% Lidocaine skin wheel.   Interscalene brachial plexus block Procedure Name: Intubation Date/Time: 04/04/2013 10:53 AM Performed by: Orvilla Fus A Pre-anesthesia Checklist: Patient identified, Emergency Drugs available, Suction available, Timeout performed and Patient being monitored Patient Re-evaluated:Patient Re-evaluated prior to inductionOxygen Delivery Method: Circle system utilized Preoxygenation: Pre-oxygenation with 100% oxygen Intubation Type: IV induction Ventilation: Mask ventilation without difficulty Laryngoscope Size: Mac and 3 Grade View: Grade I Tube type: Oral Tube size: 7.0 mm Number of attempts: 1 Airway Equipment and Method: Stylet and LTA kit utilized Placement Confirmation: ETT inserted through vocal cords under direct vision,  breath sounds checked- equal and bilateral and positive ETCO2 Secured at: 20 cm Tube secured with: Tape Dental Injury: Teeth and Oropharynx as per pre-operative assessment

## 2013-04-04 NOTE — H&P (Signed)
[name] is an 78 y.o. female.   Chief Complaint: R shoulder pain and dysfunction  HPI: s/p R shoulder fracture dislocation with reduction and subsequent redislocation after a fall in the bathtub. The shoulder is now reportedly been dislocated for 3-4 weeks.  Past Medical History  Diagnosis Date  . Vitamin D deficiency   . OA (osteoarthritis)   . Morbid obesity   . Fracture of rib of left side   . Aortic stenosis   . Dyspnea     DYSPNEA WITH EXERTION  . Constipation     OCCASIONAL CONSTIPATION  . Hypertension     takes Amlodipine daily  . Joint pain   . Joint swelling   . Eczema   . Psoriasis   . Gastric ulcer   . Urinary frequency   . Urinary urgency   . Incontinence of urine   . Nocturia   . Depression     takes Zoloft daily  . Sleep apnea     doesn't use a cpap;used to until 71yr ago;sleep study done 7-40yrs ago    Past Surgical History  Procedure Laterality Date  . Cataract surgery    . Tonsillectomy    . Ovary removed secondary to cust    . 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 Tonsillectomy      as child  . Joint replacement      L TOTAL KNEE 05/2011  . Dental surgery      TEETH EXTRACTED  . Total knee arthroplasty  08/24/2011    Procedure: TOTAL KNEE ARTHROPLASTY;  Surgeon: Shelda Pal;  Location: [facility];  Service: Orthopedics;  Laterality: Right;  with Femoral Nerve Block  . Femur im nail  07/16/2012    Procedure: INTRAMEDULLARY (IM) NAIL FEMORAL;  Surgeon: Shelda Pal, MD;  Location: [facility];  Service: Orthopedics;  Laterality: Left;  . Valve repalcement i  2012  . Esophagogastroduodenoscopy    . Colonoscopy      Family History  Problem Relation Age of Onset  . Cancer    . Diabetes     Social History:  reports that she quit smoking about 33 years ago. She does not have any smokeless tobacco history on file. She reports that  drinks alcohol. She reports that she uses illicit drugs.  Allergies:  Allergies  Allergen Reactions   . Penicillins Hives    Medications Prior to Admission  Medication Sig Dispense Refill  . amLODipine (NORVASC) 5 MG tablet Take 10 mg by mouth daily.       Marland Kitchen HYDROcodone-acetaminophen (NORCO/VICODIN) 5-325 MG per tablet Take 1 tablet by mouth every 4 (four) hours as needed for pain.  15 tablet  0  . sertraline (ZOLOFT) 50 MG tablet Take 50 mg by mouth daily.        No results found for this or any previous visit (from the past 48 hour(s)). No results found.  Review of Systems  All other systems reviewed and are negative.    Blood pressure 157/91, pulse 93, temperature 98.3 F (36.8 C), temperature source Oral, resp. rate 18, height 5' (1.524 m), weight 81.647 kg (180 lb), SpO2 95.00%. Physical Exam  Constitutional: She is oriented to person, place, and time. She appears well-developed and well-nourished.  Eyes: EOM are normal.  Cardiovascular: Intact distal pulses.   Respiratory: Effort normal.  Musculoskeletal:       Right shoulder: She exhibits decreased range of motion, tenderness and pain.  Neurological: She is alert  and oriented to person, place, and time.  Skin: Skin is warm and dry.  Psychiatric: She has a normal mood and affect.     Assessment/Plan Right shoulder chronic fracture dislocation We'll take her to the operating room and get her under general anesthesia. If we are able to satisfactorily reduce the shoulder we will put her in a sling immobilizer and try to let things scar in. If it cannot be reduced in a closed fashion we will go forward with open reduction. She can be maintained with a simple open reduction with adequate reduction of the fracture fragment we may be able to do minimal surgery. If not we will need to consider internal fixation versus reverse total shoulder arthroplasty. She understands and is agreeable to all of these potential options.  Mable Paris 04/04/2013, 9:37 AM

## 2013-04-04 NOTE — Transfer of Care (Signed)
Immediate Anesthesia Transfer of Care Note  Patient: [name]  Procedure(s) Performed: Procedure(s): RIGHT SHOULDER CLOSED REDUCTION  (Right) VERSES REVERSE TOTAL SHOULDER REPLACEMENT  (Right)  Patient Location: PACU  Anesthesia Type:General  Level of Consciousness: awake and alert   Airway & Oxygen Therapy: Patient Spontanous Breathing and Patient connected to nasal cannula oxygen  Post-op Assessment: Report given to PACU RN, Post -op Vital signs reviewed and stable and Patient moving all extremities  Post vital signs: Reviewed and stable  Complications: No apparent anesthesia complications

## 2013-04-04 NOTE — Preoperative (Signed)
Beta Blockers   Reason not to administer Beta Blockers:Not Applicable 

## 2013-04-05 LAB — CBC
HCT: 29.6 % — ABNORMAL LOW (ref 36.0–46.0)
Hemoglobin: 9.7 g/dL — ABNORMAL LOW (ref 12.0–15.0)
MCHC: 32.8 g/dL (ref 30.0–36.0)
RBC: 3.24 MIL/uL — ABNORMAL LOW (ref 3.87–5.11)

## 2013-04-05 LAB — BASIC METABOLIC PANEL
BUN: 11 mg/dL (ref 6–23)
CO2: 28 mEq/L (ref 19–32)
GFR calc non Af Amer: 80 mL/min — ABNORMAL LOW (ref >90)
Glucose, Bld: 99 mg/dL (ref 70–99)
Potassium: 4.3 mEq/L (ref 3.5–5.1)
Sodium: 135 mEq/L (ref 135–145)

## 2013-04-05 MED ORDER — OXYCODONE-ACETAMINOPHEN 5-325 MG PO TABS: 1.0000 | ORAL_TABLET | ORAL | Status: DC | PRN

## 2013-04-05 NOTE — Progress Notes (Signed)
Patient's 04/05/13 d/c order home cancelled per Jiles Harold, PA, as OT is recommending SNF. PT eval and treatment order in place.

## 2013-04-05 NOTE — Progress Notes (Signed)
UR COMPLETED  

## 2013-04-05 NOTE — Progress Notes (Signed)
PATIENT ID: Fernanda Drum   1 Day Post-Op Procedure(s) (LRB): RIGHT SHOULDER CLOSED REDUCTION  (Right) VERSES REVERSE TOTAL SHOULDER REPLACEMENT  (Right)  Subjective: Feelings okay today, tired. Pain well controlled. Okay to go home today, daughter will be home with her to help with activities.   Objective:  Filed Vitals:   04/05/13 0519  BP: 137/57  Pulse: 103  Temp: 99 F (37.2 C)  Resp: 14     Awake, alert R UE dressing c/d/i Wiggles fingers, distally NVI Drain removed today  Labs:   Recent Labs  04/04/13 0854 04/05/13 0428  HGB 13.5 9.7*   Recent Labs  04/04/13 0854 04/05/13 0428  WBC 5.5 6.3  RBC 4.44 3.24*  HCT 40.5 29.6*  PLT 181 149*   Recent Labs  04/04/13 0854 04/05/13 0428  NA 139 135  K 4.3 4.3  CL 101 102  CO2 28 28  BUN 13 11  CREATININE 0.76 0.76  GLUCOSE 93 99  CALCIUM 8.9 8.1*    Assessment and Plan: 1 day s/p reverse total shoulder replacement Will work with OT on hand wrist elbow ROM, sling edu Continue current pain mgmt Likely d/c home today with daughter at home to help Percocet for home pain control, script in chart  VTE proph: ASA 325mg  BID, SCDs

## 2013-04-05 NOTE — Clinical Social Work Psychosocial (Signed)
Clinical Social Work Department  BRIEF PSYCHOSOCIAL ASSESSMENT  Patient:Goldia ISHANI GOLDWASSER Account Number: [id number]   Admit date: 04/04/13 Clinical Social Worker Sabino Niemann, MSW Date/Time: 04/05/2013 4:00PM Referred by: Physician Date Referred:  Referred for   SNF Placement   Other Referral:  Interview type: Patient and patient's daughter Other interview type: PSYCHOSOCIAL DATA  Living Status:Daughter Admitted from facility:  Level of care:  Primary support name:Coffey,Harriett   Primary support relationship to patient: Daughter Degree of support available:  Strong and vested  CURRENT CONCERNS  Current Concerns   Post-Acute Placement   Other Concerns:  SOCIAL WORK ASSESSMENT / PLAN  CSW met with pt re: OT recommendation for SNF.   Pt lives with her daughter  CSW explained placement process and answered questions.   Pt reports [facility] as her preference    CSW completed FL2 and initiated SNF search.     Assessment/plan status: Information/Referral to [facility]  Other assessment/ plan:  Information/referral to community resources:  SNF     PATIENT'S/FAMILY'S RESPONSE TO PLAN OF CARE:  Pt  reports she is agreeable to [facility] in order to increase strength and independence with mobility prior to returning home  Pt verbalized understanding of placement process and appreciation for CSW assist.   Sabino Niemann, MSW [phone]

## 2013-04-05 NOTE — Evaluation (Addendum)
Occupational Therapy Evaluation Patient Details Name: [name] MRN: [id number] DOB: 1934-10-29 Today's Date: 04/05/2013 Time: 7829-5621 OT Time Calculation (min): 61 min  OT Assessment / Plan / Recommendation Clinical Impression   78 y.o. Pt s/p right reverse total shoulder replacement. Pt with several falls at home. OT spoke with daughter and pt has had steady decline. Pt with significant mobility issues and required Mod A for stand pivot transfers. Recommend PT consult. OT recommending SNF for d/c for additional rehab.     OT Assessment  Patient needs continued OT Services    Follow Up Recommendations  SNF;Supervision/Assistance - 24 hour    Barriers to Discharge      Equipment Recommendations  Other (comment) (tbd)    Recommendations for Other Services PT consult  Frequency  Min 2X/week    Precautions / Restrictions Precautions Precautions: Shoulder;Fall Type of Shoulder Precautions: Sling education, ADLs, AROM elbow, wrist, hand. Precaution Booklet Issued: Yes (comment) Precaution Comments: has had several falls at home Required Braces or Orthoses: Other Brace/Splint (shoulder sling) Restrictions Weight Bearing Restrictions: Yes RUE Weight Bearing: Non weight bearing Other Position/Activity Restrictions: No movement of shoudler   Pertinent Vitals/Pain Pain 4/10. Nurse brought pain meds.     ADL  Toilet Transfer: Performed;Moderate assistance Toilet Transfer Method: Stand pivot Toilet Transfer Equipment: Raised toilet seat with arms (or 3-in-1 over toilet) Toileting - Clothing Manipulation and Hygiene: +1 Total assistance Where Assessed - Toileting Clothing Manipulation and Hygiene: Sit to stand from 3-in-1 or toilet Equipment Used: Gait belt;Other (comment) (shoulder sling) Transfers/Ambulation Related to ADLs: Mod A for transfers. Knee buckling while taking steps ADL Comments: Reviewed all shoulder information/handout with pt's daughter. Perfomed exercises of  elbow, wrist, and hand requiring assistance. Pt is very HOH. Pt with significant mobility deficits and only pivoted to 3 in1  and wheelchair.    OT Diagnosis: Acute pain;Generalized weakness  OT Problem List: Decreased range of motion;Decreased strength;Decreased activity tolerance;Impaired balance (sitting and/or standing);Decreased safety awareness;Decreased knowledge of use of DME or AE;Decreased knowledge of precautions;Pain;Impaired UE functional use OT Treatment Interventions: Self-care/ADL training;Therapeutic exercise;DME and/or AE instruction;Therapeutic activities;Patient/family education;Balance training   OT Goals Acute Rehab OT Goals OT Goal Formulation: With patient/family Time For Goal Achievement: 04/12/13 Potential to Achieve Goals: Fair ADL Goals Pt Will Transfer to Toilet: with DME;with min assist ADL Goal: Toilet Transfer - Progress: Goal set today Pt Will Perform Toileting - Clothing Manipulation: with min assist;Standing ADL Goal: Toileting - Clothing Manipulation - Progress: Goal set today Pt Will Perform Toileting - Hygiene: with min assist;Sit to stand from 3-in-1/toilet;Sitting on 3-in-1 or toilet ADL Goal: Toileting - Hygiene - Progress: Goal set today Miscellaneous OT Goals Miscellaneous OT Goal #1: Pt will be independent in HEP of AROM of elbow, wrist, and hand. OT Goal: Miscellaneous Goal #1 - Progress: Goal set today Miscellaneous OT Goal #2: Caregiver will be indpendent in donning/doffing sling while maintaining shoulder precautions. OT Goal: Miscellaneous Goal #2 - Progress: Goal set today Miscellaneous OT Goal #3: Caregiver will be independent in donning/doffing shirt while maintaining shoulder precautions or pt will be independent in directing caregiver. OT Goal: Miscellaneous Goal #3 - Progress: Goal set today  Visit Information  Last OT Received On: 04/05/13 Assistance Needed: +1    Subjective Data      Prior Functioning     Home Living Lives  With: Family Available Help at Discharge: Family;Available 24 hours/day (31 year old grandson/daughter only there 24/7 until friday) Type of Home: Other (Comment) (staying  in hotel at the time) Bathroom Shower/Tub: Tub/shower unit Home Adaptive Equipment: Walker - rolling;Wheelchair - manual Prior Function Level of Independence: Needs assistance Needs Assistance: Bathing;Dressing;Toileting;Light Housekeeping;Meal Prep;Transfers Communication Communication: HOH         Vision/Perception     Copywriter, advertising Arousal/Alertness: Awake/alert Behavior During Therapy: WFL for tasks assessed/performed Overall Cognitive Status: Within Functional Limits for tasks assessed    Extremity/Trunk Assessment Right Upper Extremity Assessment RUE ROM/Strength/Tone: Deficits;Unable to fully assess;Due to precautions;Due to pain RUE ROM/Strength/Tone Deficits: Reverse TSA Left Upper Extremity Assessment LUE ROM/Strength/Tone: [facility] for tasks assessed     Mobility Bed Mobility Bed Mobility: Supine to Sit;Sit to Supine;Scooting to HOB Supine to Sit: 4: Min assist;With rails;HOB elevated Sit to Supine: 3: Mod assist Scooting to [facility]: 3: Mod assist Details for Bed Mobility Assistance: Min A to elevate trunk from bed. Mod A to return supine from sitting position to assist with legs and trunk. Transfers Transfers: Sit to Stand;Stand to Sit Sit to Stand: 4: Min assist;With upper extremity assist;From bed;From chair/3-in-1;Other (comment) (from wheelchair) Stand to Sit: 4: Min assist;With upper extremity assist;To chair/3-in-1;To bed;Other (comment) (to wheelchair) Details for Transfer Assistance: Mod A for stand pivot transfer.      Exercise Shoulder Exercises Elbow Flexion: AROM;Right;10 reps;Seated Elbow Extension: AROM;Right;10 reps;Seated Wrist Flexion: AROM;Right;Seated;5 reps Wrist Extension: AROM;Right;Seated;5 reps Digit Composite Flexion: AROM;Right;5 reps;Seated Composite Extension:  AROM;Right;5 reps;Seated Donning/doffing shirt without moving shoulder: Supervision/safety Method for sponge bathing under operated UE: Caregiver independent with task Donning/doffing sling/immobilizer: Minimal assistance Correct positioning of sling/immobilizer: Minimal assistance ROM for elbow, wrist and digits of operated UE: Minimal assistance Sling wearing schedule (on at all times/off for ADL's): Supervision/safety Proper positioning of operated UE when showering: Caregiver independent with task Positioning of UE while sleeping: Caregiver independent with task   Balance     End of Session OT - End of Session Equipment Utilized During Treatment: Gait belt;Other (comment) (shoulder sling) Activity Tolerance: Patient limited by fatigue Patient left: in bed;with call bell/phone within reach;with family/visitor present Nurse Communication: Mobility status  GO     Earlie Raveling OTR/L [phone] 04/05/2013, 2:14 PM

## 2013-04-05 NOTE — Clinical Social Work Placement (Addendum)
Clinical Social Work Department  CLINICAL SOCIAL WORK PLACEMENT NOTE  04/05/2013  Patient: [name]  Account Number: [id number] Admit date:04/04/13 Clinical Social Worker: Sabino Niemann MSW Date/time: 04/05/2013 4:30 AM  Clinical Social Work is seeking post-discharge placement for this patient at the following level of care: SKILLED NURSING (*CSW will update this form in Epic as items are completed)  04/05/2013 Patient/family provided with [facility] Department of Clinical Social Work's list of facilities offering this level of care within the geographic area requested by the patient (or if unable, by the patient's family).  04/05/2013 Patient/family informed of their freedom to choose among providers that offer the needed level of care, that participate in Medicare, Medicaid or managed care program needed by the patient, have an available bed and are willing to accept the patient.  04/05/2013 Patient/family informed of [facility]' ownership interest in [facility], as well as of the fact that they are under no obligation to receive care at this facility.  PASARR submitted to EDS on pre-existing PASARR number received from [employer] on  FL2 transmitted to all facilities in geographic area requested by pt/family on 04/05/2013 FL2 transmitted to all facilities within larger geographic area on  Patient informed that his/her managed care company has contracts with or will negotiate with certain facilities, including the following:  Patient/family informed of bed offers received: 04/06/2013 Patient chooses bed at [facility] Physician recommends and patient chooses bed at  Patient to be transferred to on 04/06/2013 Patient to be transferred to facility by [facility] The following physician request were entered in Epic:  Additional Comments:

## 2013-04-06 ENCOUNTER — Encounter (HOSPITAL_COMMUNITY): Payer: Self-pay | Admitting: Orthopedic Surgery

## 2013-04-06 LAB — CBC
HCT: 28.4 % — ABNORMAL LOW (ref 36.0–46.0)
Hemoglobin: 9.4 g/dL — ABNORMAL LOW (ref 12.0–15.0)
MCH: 30.1 pg (ref 26.0–34.0)
MCHC: 33.1 g/dL (ref 30.0–36.0)
MCV: 91 fL (ref 78.0–100.0)
RBC: 3.12 MIL/uL — ABNORMAL LOW (ref 3.87–5.11)

## 2013-04-06 NOTE — Discharge Summary (Addendum)
Patient ID: [name] MRN: [id number] DOB/AGE: [birth year] 76 y.o.  Admit date: 04/04/2013 Discharge date: 04/06/2013  Admission Diagnoses:  Principal Problem:   Fracture dislocation of shoulder joint   Discharge Diagnoses:  Same  Past Medical History  Diagnosis Date  . Vitamin D deficiency   . OA (osteoarthritis)   . Morbid obesity   . Fracture of rib of left side   . Aortic stenosis   . Dyspnea     DYSPNEA WITH EXERTION  . Constipation     OCCASIONAL CONSTIPATION  . Hypertension     takes Amlodipine daily  . Joint pain   . Joint swelling   . Eczema   . Psoriasis   . Gastric ulcer   . Urinary frequency   . Urinary urgency   . Incontinence of urine   . Nocturia   . Depression     takes Zoloft daily  . Sleep apnea     doesn't use a cpap;used to until 32yr ago;sleep study done 7-41yrs ago    Surgeries: Procedure(s): RIGHT SHOULDER CLOSED REDUCTION  VERSES REVERSE TOTAL SHOULDER REPLACEMENT  on 04/04/2013   Consultants:    Discharged Condition: Improved  Hospital Course: [name] is an 78 y.o. female who was admitted 04/04/2013 for operative treatment ofFracture dislocation of shoulder joint. Patient has a history of fall with dislocation x1 and one week later had a fall in the bathtub resulting in a fracture dislocation of the humerus. Closed reduction under anesthesia was attempted and failed.To properly reduce shoulder, decrease patient's pain and give the best chance of future stability, a reverse total shoulder was determined the best treatment option.  After pre-op clearance the patient was taken to the operating room on 04/04/2013 and underwent  Procedure(s): RIGHT SHOULDER CLOSED REDUCTION  VERSES REVERSE TOTAL SHOULDER REPLACEMENT .    Patient was given perioperative antibiotics: Anti-infectives   Start     Dose/Rate Route Frequency Ordered Stop   04/04/13 1700  ceFAZolin (ANCEF) IVPB 2 g/50 mL premix     2 g 100 mL/hr over 30 Minutes Intravenous  Every 6 hours 04/04/13 1557 04/05/13 0509   04/04/13 1045  ceFAZolin (ANCEF) IVPB 2 g/50 mL premix     2 g 100 mL/hr over 30 Minutes Intravenous  Once 04/04/13 1031 04/04/13 1105   04/04/13 1037  ceFAZolin (ANCEF) 2-3 GM-% IVPB SOLR    Comments:  HIGHTOWER, [name]: cabinet override      04/04/13 1037 04/04/13 2244       Patient was given sequential compression devices, early ambulation, and ASA 325mg  BID to prevent DVT.  Hgb down to 9.4 postop: expected acute blood loss anemia.  Remained asymptomatic, observed with repeat Hgb x 2 days.  Patient benefited maximally from hospital stay. OT reccommended discharge to SNF due to patient's balance problems and fall risk.    Recent vital signs: Patient Vitals for the past 24 hrs:  BP Temp Temp src Pulse Resp SpO2  04/06/13 0601 125/51 mmHg 98.8 F (37.1 C) Oral 101 18 93 %  04/05/13 2126 97/63 mmHg 99.3 F (37.4 C) Oral 96 18 96 %     Recent laboratory studies:  Recent Labs  04/04/13 0854 04/05/13 0428 04/06/13 0745  WBC 5.5 6.3 7.4  HGB 13.5 9.7* 9.4*  HCT 40.5 29.6* 28.4*  PLT 181 149* 151  NA 139 135  --   K 4.3 4.3  --   CL 101 102  --   CO2 28 28  --  BUN 13 11  --   CREATININE 0.76 0.76  --   GLUCOSE 93 99  --   INR 0.99  --   --   CALCIUM 8.9 8.1*  --      Discharge Medications:     Medication List    STOP taking these medications       HYDROcodone-acetaminophen 5-325 MG per tablet  Commonly known as:  NORCO/VICODIN      TAKE these medications       amLODipine 5 MG tablet  Commonly known as:  NORVASC  Take 10 mg by mouth daily.     oxyCODONE-acetaminophen 5-325 MG per tablet  Commonly known as:  ROXICET  Take 1-2 tablets by mouth every 4 (four) hours as needed for pain.     sertraline 50 MG tablet  Commonly known as:  ZOLOFT  Take 50 mg by mouth daily.        Diagnostic Studies: Dg Shoulder Right  04/04/2013   *RADIOLOGY REPORT*  Clinical Data:  Reverse shoulder arthroplasty.  DG C-ARM GT 120  MIN,RIGHT SHOULDER - 2+ VIEW  Comparison:  03/12/2013.  Findings:   Fluoroscopic spot images demonstrate initial fracture dislocation involving the humeral head and neck.  Subsequent placement of well seated components of a total shoulder arthroplasty.  No complicating features are demonstrated.  IMPRESSION:  Well seated components of a reverse total shoulder arthroplasty without complicating features.   Original Report Authenticated By: Rudie Meyer, M.D.   Dg Shoulder Right  03/11/2013   *RADIOLOGY REPORT*  Clinical Data: Fall and pain  RIGHT SHOULDER - 2+ VIEW  Comparison: Humerus radiograph same date  Findings: Fracture dislocation of the right proximal humerus is redemonstrated with avulsion of the greater tuberosity.  AC joint distance is normal.  Right lung apex clear.  Median sternotomy wires partly visualized.  IMPRESSION: Fracture dislocation of the right proximal humerus.  Findings called to Dr. Freida Busman by Dr. Chilton Si on 03/11/2013 at 11:05 p.m.   Original Report Authenticated By: Christiana Pellant, M.D.   Dg Shoulder Right Port  04/04/2013   *RADIOLOGY REPORT*  Clinical Data: Status post right shoulder arthroplasty.  PORTABLE RIGHT SHOULDER - 2+ VIEW  Comparison: Earlier today.  Findings: Right total shoulder prosthesis in satisfactory position and alignment.  No fracture or dislocation other than the previously demonstrated greater tuberosity fracture.  IMPRESSION: Satisfactory postoperative appearance of a right total shoulder prosthesis.   Original Report Authenticated By: Beckie Salts, M.D.   Dg Shoulder Right Port  03/12/2013   *RADIOLOGY REPORT*  Clinical Data: Attempted reduction of right shoulder dislocation.  PORTABLE RIGHT SHOULDER - 2+ VIEW  Comparison: None.  Findings: The fracture-dislocation of the right humeral head is unchanged in appearance.  Comminuted greater tuberosity fragments are stable.  The humeral head is lodged against the inferior glenoid, with question of an underlying  Bankart lesion.  The right acromioclavicular joint is unremarkable in appearance. The visualized portions the lungs are grossly clear.  IMPRESSION: Unchanged appearance to the fracture-dislocation of the right humeral head, with comminuted greater tuberosity fragments.  The humeral head is lodged against the inferior glenoid, with question of an underlying Bankart lesion.   Original Report Authenticated By: Tonia Ghent, M.D.   Dg Shoulder Right Port  03/12/2013   *RADIOLOGY REPORT*  Clinical Data: Attempted relocation  PORTABLE RIGHT SHOULDER - 2+ VIEW  Comparison: Films same date  Findings: There is persistent fracture dislocation of the proximal right humerus.  No significant change.  IMPRESSION: Persistent fracture  dislocation of the proximal right humerus.   Original Report Authenticated By: Christiana Pellant, M.D.   Dg Humerus Right  03/11/2013   *RADIOLOGY REPORT*  Clinical Data: Fall and pain  RIGHT HUMERUS - 2+ VIEW  Comparison: Chest radiograph 11/26/2012.  No previous exam dedicated to the shoulder.  Findings: Fracture dislocation of the right proximal humerus is identified.  Avulsion of the greater tuberosity is noted.  Right lung apex is clear.  AC joint distance is normal.  IMPRESSION: Right proximal humeral fracture dislocation.  Findings called to Dr. Freida Busman by Dr. Chilton Si on 03/11/2013 at 11:05 p.m.   Original Report Authenticated By: Christiana Pellant, M.D.   Dg C-arm Gt 120 Min  04/04/2013   *RADIOLOGY REPORT*  Clinical Data:  Reverse shoulder arthroplasty.  DG C-ARM GT 120 MIN,RIGHT SHOULDER - 2+ VIEW  Comparison:  03/12/2013.  Findings:   Fluoroscopic spot images demonstrate initial fracture dislocation involving the humeral head and neck.  Subsequent placement of well seated components of a total shoulder arthroplasty.  No complicating features are demonstrated.  IMPRESSION:  Well seated components of a reverse total shoulder arthroplasty without complicating features.   Original Report  Authenticated By: Rudie Meyer, M.D.    Disposition: Skilled nursing facility      Discharge Orders   Future Orders Complete By Expires     Call MD / Call 911  As directed     Comments:      If you experience chest pain or shortness of breath, CALL 911 and be transported to the hospital emergency room.  If you develope a fever above 101 F, pus (white drainage) or increased drainage or redness at the wound, or calf pain, call your surgeon's office.    Call MD / Call 911  As directed     Comments:      If you experience chest pain or shortness of breath, CALL 911 and be transported to the hospital emergency room.  If you develope a fever above 101 F, pus (white drainage) or increased drainage or redness at the wound, or calf pain, call your surgeon's office.    Constipation Prevention  As directed     Comments:      Drink plenty of fluids.  Prune juice may be helpful.  You may use a stool softener, such as Colace (over the counter) 100 mg twice a day.  Use MiraLax (over the counter) for constipation as needed.    Constipation Prevention  As directed     Comments:      Drink plenty of fluids.  Prune juice may be helpful.  You may use a stool softener, such as Colace (over the counter) 100 mg twice a day.  Use MiraLax (over the counter) for constipation as needed.    Diet - low sodium heart healthy  As directed     Diet - low sodium heart healthy  As directed     Driving restrictions  As directed     Comments:      No driving until cleared by physician.    Increase activity slowly as tolerated  As directed     Increase activity slowly as tolerated  As directed     Lifting restrictions  As directed     Comments:      No lifting until cleared by physician.       Follow-up Information   Follow up with Mable Paris, MD. Schedule an appointment as soon as possible for a visit in 2  weeks.   Contact information:   [address] [place] [address] [phone]         Signed: Jiles Harold 04/06/2013, 2:25 PM

## 2013-04-06 NOTE — Evaluation (Signed)
Physical Therapy Evaluation Patient Details Name: [name] MRN: [id number] DOB: [birth year] Today's Date: 04/06/2013 Time: 4098-1191 PT Time Calculation (min): 14 min  PT Assessment / Plan / Recommendation Clinical Impression  Pt. has recently been declining and has fallen several times.  she has now undergone a reverse R TSA and had dependencies in functional mobility, transfers and gait and will need acute PT to address these issues.  Also agree with her need for ST SNF for rehab before consideration of DCing home.;    PT Assessment  Patient needs continued PT services    Follow Up Recommendations  SNF;Supervision/Assistance - 24 hour;Supervision for mobility/OOB    Does the patient have the potential to tolerate intense rehabilitation      Barriers to Discharge Decreased caregiver support      Equipment Recommendations  Other (comment) (TBD; may need hemiwalker or coane)    Recommendations for Other Services     Frequency Min 5X/week    Precautions / Restrictions Precautions Precautions: Shoulder;Fall Type of Shoulder Precautions: Sling education, ADLs, AROM elbow, wrist, hand. Precaution Comments: has had several falls at home Required Braces or Orthoses: Other Brace/Splint Other Brace/Splint: shoulder sling Restrictions Weight Bearing Restrictions: Yes RUE Weight Bearing: Non weight bearing Other Position/Activity Restrictions: No movement of shoudler   Pertinent Vitals/Pain Weldon Picking PT Acute Rehab Services [phone] Beeper [phone]       Mobility  Bed Mobility Bed Mobility: Supine to Sit;Sit to Supine;Scooting to HOB Supine to Sit: 4: Min assist;With rails;HOB elevated Sit to Supine: 3: Mod assist Scooting to [facility]: 3: Mod assist Details for Bed Mobility Assistance: min assist to elevate trunk, mod assist to manage lower body upon return to bed Transfers Transfers: Sit to Stand;Stand to Sit Sit to Stand: 3: Mod assist;With upper extremity  assist;From bed;Other (comment) (left arm only) Stand to Sit: 3: Mod assist;With upper extremity assist;To bed;Other (comment) (left arm only) Details for Transfer Assistance: mod assist needed for safety and stability Ambulation/Gait Ambulation/Gait Assistance: 3: Mod assist Ambulation Distance (Feet): 10 Feet (10 x2 with seated rest needed) Assistive device: None Ambulation/Gait Assistance Details: pt. with difficulty in advancing either LE and has decreased balance for walking.  Only able to currently take a few steps due to fatigue and instability Gait Pattern: Step-to pattern    Exercises     PT Diagnosis: Difficulty walking;Abnormality of gait;Acute pain  PT Problem List: Decreased activity tolerance;Decreased mobility;Decreased balance;Decreased knowledge of use of DME;Decreased knowledge of precautions;Pain PT Treatment Interventions: DME instruction;Gait training;Functional mobility training;Therapeutic activities;Balance training;Patient/family education   PT Goals Acute Rehab PT Goals PT Goal Formulation: With patient Time For Goal Achievement: 04/13/13 Potential to Achieve Goals: Fair Pt will go Supine/Side to Sit: with supervision PT Goal: Supine/Side to Sit - Progress: Goal set today Pt will go Sit to Supine/Side: with supervision PT Goal: Sit to Supine/Side - Progress: Goal set today Pt will go Sit to Stand: with supervision PT Goal: Sit to Stand - Progress: Goal set today Pt will go Stand to Sit: with supervision PT Goal: Stand to Sit - Progress: Goal set today Pt will Transfer Bed to Chair/Chair to Bed: with min assist PT Transfer Goal: Bed to Chair/Chair to Bed - Progress: Goal set today Pt will Ambulate: 16 - 50 feet;with least restrictive assistive device;with min assist  Visit Information  Last PT Received On: 04/06/13 Assistance Needed: +1 (+2 for progressive ambulation)    Subjective Data  Subjective: " I am going for rehab" Patient Stated  Goal: rehab  then home   Prior Functioning  Home Living Lives With: Family Available Help at Discharge: Family;Available 24 hours/day;Other (Comment) (only until Friday) Type of Home: Other (Comment) (hotel currently) Prior Function Level of Independence: Needs assistance Needs Assistance: Bathing;Dressing;Toileting;Light Housekeeping;Meal Prep;Transfers Transfer Assistance: min assist since shoulder injury Communication Communication: HOH Dominant Hand: Right    Cognition  Cognition Arousal/Alertness: Awake/alert Behavior During Therapy: WFL for tasks assessed/performed Overall Cognitive Status: Within Functional Limits for tasks assessed    Extremity/Trunk Assessment Right Upper Extremity Assessment RUE ROM/Strength/Tone: Deficits;Unable to fully assess;Due to precautions;Due to pain RUE ROM/Strength/Tone Deficits: Reverse TSA Left Upper Extremity Assessment LUE ROM/Strength/Tone: WFL for tasks assessed Right Lower Extremity Assessment RLE ROM/Strength/Tone: WFL for tasks assessed RLE Sensation: WFL - Light Touch RLE Coordination: WFL - gross/fine motor Left Lower Extremity Assessment LLE ROM/Strength/Tone: WFL for tasks assessed LLE Sensation: WFL - Light Touch LLE Coordination: WFL - gross/fine motor Trunk Assessment Trunk Assessment: Normal   Balance Balance Balance Assessed: Yes Static Standing Balance Static Standing - Balance Support: Left upper extremity supported Static Standing - Level of Assistance: 3: Mod assist  End of Session PT - End of Session Equipment Utilized During Treatment: Gait belt;Other (comment) (right shoulder sling) Activity Tolerance: Patient limited by fatigue;Patient limited by pain Patient left: in bed;with call bell/phone within reach Nurse Communication: Mobility status  GP     Ferman Hamming 04/06/2013, 2:47 PM

## 2013-04-06 NOTE — Progress Notes (Signed)
Clinical social worker assisted with patient discharge to skilled nursing facility, [facility].  CSW addressed all family questions and concerns. CSW copied chart and added all important documents. CSW also set up patient transportation with [facility]. Clinical Social Worker will sign off for now as social work intervention is no longer needed.  Butler Vegh, MSW, [phone] 

## 2013-04-06 NOTE — Progress Notes (Signed)
Occupational Therapy Treatment Patient Details Name: [name] MRN: [id number] DOB: [birth year] Today's Date: 04/06/2013 Time: 8295-6213 OT Time Calculation (min): 25 min  OT Assessment / Plan / Recommendation Comments on Treatment Session pt. active with participation in tx. session but limited by mobility/balance deficits    Follow Up Recommendations  SNF;Supervision/Assistance - 24 hour                 Recommendations for Other Services PT consult  Frequency Min 2X/week         Precautions / Restrictions Precautions Precautions: Shoulder;Fall Restrictions RUE Weight Bearing: Non weight bearing   Pertinent Vitals/Pain 4/10    ADL  Grooming: Performed;Wash/dry hands;Wash/dry face;Set up Where Assessed - Grooming: Supported sitting Upper Body Bathing: Performed;Chest;Right arm;Left arm;Abdomen;Minimal assistance Where Assessed - Upper Body Bathing: Supported sitting Lower Body Bathing: Performed;Min guard Where Assessed - Lower Body Bathing: Supported sit to stand Upper Body Dressing: Performed;Moderate assistance Where Assessed - Upper Body Dressing: Supported sitting Toilet Transfer: Performed;Moderate assistance Toilet Transfer Method: Stand pivot Toilet Transfer Equipment: Bedside commode Toileting - Clothing Manipulation and Hygiene: Performed;Minimal assistance Where Assessed - Glass blower/designer Manipulation and Hygiene: Standing Transfers/Ambulation Related to ADLs: mod a, pt. very unsteady, during peri-care sat down without warning and without reaching back for arm rests ADL Comments: able to don/doff sling with mod a, mod inst.cues. reviewed rom exercises to [facility]         OT Goals ADL Goals ADL Goal: Statistician - Progress: Progressing toward goals ADL Goal: Toileting - Clothing Manipulation - Progress: Progressing toward goals ADL Goal: Toileting - Hygiene - Progress: Progressing toward goals Miscellaneous OT Goals OT Goal: Miscellaneous Goal #1  - Progress: Progressing toward goals OT Goal: Miscellaneous Goal #2 - Progress: Progressing toward goals  Visit Information  Last OT Received On: 04/06/13    Subjective Data  Subjective: "i don't know what's going on honey"          Cognition  Cognition Arousal/Alertness: Awake/alert Behavior During Therapy: WFL for tasks assessed/performed Overall Cognitive Status: Within Functional Limits for tasks assessed    Mobility  Transfers Transfers: Sit to Stand;Stand to Sit Sit to Stand: 3: Mod assist;With upper extremity assist;With armrests;From chair/3-in-1;From toilet Stand to Sit: 3: Mod assist;With upper extremity assist;With armrests;To chair/3-in-1 Details for Transfer Assistance: mod a for sit/stand, and pivotal steps.  pt. very shaky required max cues for hand placement during transfer. sat without warning x 1 during peri-care                End of Session OT - End of Session Activity Tolerance: Patient limited by fatigue Patient left: in chair;with call bell/phone within reach       Robet Leu 04/06/2013, 9:41 AM

## 2013-04-06 NOTE — Clinical Documentation Improvement (Signed)
Anemia Blood Loss Clarification  THIS DOCUMENT IS NOT A PERMANENT PART OF THE MEDICAL RECORD  RESPOND TO THE THIS QUERY, FOLLOW THE INSTRUCTIONS BELOW:  1. If needed, update documentation for the patient's encounter via the notes activity.  2. Access this query again and click edit on the In [employer].  3. After updating, or not, click F2 to complete all highlighted (required) fields concerning your review. Select "additional documentation in the medical record" OR "no additional documentation provided".  4. Click Sign note button.  5. The deficiency will fall out of your In Basket *Please let us know if you are not able to complete this workflow by phone or e-mail (listed below).        04/06/13  Dear Dr. Geni Bers Marton Redwood  In an effort to better capture your patient's severity of illness, reflect appropriate length of stay and utilization of resources, a review of the patient medical record has revealed the following indicators.    Based on your clinical judgment, please clarify and document in a progress note and/or discharge summary the clinical condition associated with the following supporting information:  In responding to this query please exercise your independent judgment.  The fact that a query is asked, does not imply that any particular answer is desired or expected.  Noted patient had  500 EBL out  and 250 ml albumin given during surgery, H/H [phone] (6/17) 9.4/28.4 (6/19) post operative.  Would either of the following diagnoses be appropriate?  Thank you  Possible Clinical Conditions?   " Expected Acute Blood Loss Anemia  " Acute Blood Loss Anemia   " Precipitous drop in Hematocrit  " Other Condition  " Cannot Clinically Determine     IV fluids / plasma expanders: Albumin 250 ml/  NS @ 125 ml post op Serial H&H monitoring: CBC daily x 3   Reviewed: additional documentation in the medical record in dc summary  Thank You,  Lavonda Jumbo   Clinical Documentation Specialist: Pager [phone] Office [phone]  Health Information Management [place]

## 2013-04-06 NOTE — Progress Notes (Signed)
PATIENT ID: [name]   2 Days Post-Op Procedure(s) (LRB): RIGHT SHOULDER CLOSED REDUCTION  (Right) VERSES REVERSE TOTAL SHOULDER REPLACEMENT  (Right)  Subjective: Feeling good, reports no pain. Tolerating solid foods well. No other complaints or concerns today.   Objective:  Filed Vitals:   04/06/13 0601  BP: 125/51  Pulse: 101  Temp: 98.8 F (37.1 C)  Resp: 18     Awake, alert, orientated R UE dressing c/d/i Wiggles fingers, distally nvi  Labs:   Recent Labs  04/04/13 0854 04/05/13 0428  HGB 13.5 9.7*   Recent Labs  04/04/13 0854 04/05/13 0428  WBC 5.5 6.3  RBC 4.44 3.24*  HCT 40.5 29.6*  PLT 181 149*   Recent Labs  04/04/13 0854 04/05/13 0428  NA 139 135  K 4.3 4.3  CL 101 102  CO2 28 28  BUN 13 11  CREATININE 0.76 0.76  GLUCOSE 93 99  CALCIUM 8.9 8.1*    Assessment and Plan: Day 2 s/p reverse total shoulder arthroplasty Will d/c to SNF once placement is made per OT reccomendation Will work with PT today prior to d/c Continue current pain mgmt Percocet for outpatient pain control  VTE proph: ASA325mg  BID, SCDs

## 2013-04-07 ENCOUNTER — Non-Acute Institutional Stay (SKILLED_NURSING_FACILITY): Payer: Medicare HMO | Admitting: Internal Medicine

## 2013-04-07 ENCOUNTER — Other Ambulatory Visit: Payer: Self-pay | Admitting: Geriatric Medicine

## 2013-04-07 DIAGNOSIS — D62 Acute posthemorrhagic anemia: Secondary | ICD-10-CM

## 2013-04-07 DIAGNOSIS — S4291XS Fracture of right shoulder girdle, part unspecified, sequela: Secondary | ICD-10-CM

## 2013-04-07 DIAGNOSIS — I10 Essential (primary) hypertension: Secondary | ICD-10-CM

## 2013-04-07 DIAGNOSIS — F329 Major depressive disorder, single episode, unspecified: Secondary | ICD-10-CM

## 2013-04-07 DIAGNOSIS — S42309S Unspecified fracture of shaft of humerus, unspecified arm, sequela: Secondary | ICD-10-CM

## 2013-04-07 MED ORDER — OXYCODONE-ACETAMINOPHEN 5-325 MG PO TABS: 1.0000 | ORAL_TABLET | ORAL | Status: DC | PRN

## 2013-05-03 DIAGNOSIS — I10 Essential (primary) hypertension: Secondary | ICD-10-CM | POA: Insufficient documentation

## 2013-05-03 DIAGNOSIS — D62 Acute posthemorrhagic anemia: Secondary | ICD-10-CM | POA: Insufficient documentation

## 2013-05-03 DIAGNOSIS — F329 Major depressive disorder, single episode, unspecified: Secondary | ICD-10-CM | POA: Insufficient documentation

## 2013-05-03 NOTE — Progress Notes (Signed)
Patient ID: [name], female   DOB: Aug 04, 1935, 78 y.o.   MRN: [id number]        HISTORY & PHYSICAL  DATE: 04/07/2013   FACILITY: [facility]  LEVEL OF CARE: SNF (31)  ALLERGIES:  Allergies  Allergen Reactions  . Penicillins Hives    CHIEF COMPLAINT:  Manage right shoulder fracture, acute blood loss anemia, and hypertension.    HISTORY OF PRESENT ILLNESS:  The patient is a 78 year-old, Caucasian female.    RIGHT SHOULDER FRACTURE AND DISLOCATION:  Patient had a fall and then sustained fracture and dislocation of the right humerus.  Closed reduction under anesthesia was attempted and failed.  Therefore, she underwent right shoulder closed reduction and total shoulder replacement and tolerated the procedure well.  She is admitted to this facility for short-term rehabilitation.  She denies shoulder pain currently.  She has a right upper extremity sling.    HTN: Pt 's HTN remains stable.  Denies CP, sob, DOE, pedal edema, headaches, dizziness or visual disturbances.  No complications from the medications currently being used.  Last BP : 100/53, 114/63.  ANEMIA: Postoperatively, patient suffered acute blood loss.  The anemia has been stable. The patient denies fatigue, melena or hematochezia. The patient is currently not on iron.    Last hemoglobins are:  9.4, 9.7, 13.5.     PAST MEDICAL HISTORY :  Past Medical History  Diagnosis Date  . Vitamin D deficiency   . OA (osteoarthritis)   . Morbid obesity   . Fracture of rib of left side   . Aortic stenosis   . Dyspnea     DYSPNEA WITH EXERTION  . Constipation     OCCASIONAL CONSTIPATION  . Hypertension     takes Amlodipine daily  . Joint pain   . Joint swelling   . Eczema   . Psoriasis   . Gastric ulcer   . Urinary frequency   . Urinary urgency   . Incontinence of urine   . Nocturia   . Depression     takes Zoloft daily  . Sleep apnea     doesn't use a cpap;used to until 42yr ago;sleep study done  7-75yrs ago    PAST SURGICAL HISTORY: Past Surgical History  Procedure Laterality Date  . Cataract surgery    . Tonsillectomy    . Ovary removed secondary to cust    . 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 Tonsillectomy      as child  . Joint replacement      L TOTAL KNEE 05/2011  . Dental surgery      TEETH EXTRACTED  . Total knee arthroplasty  08/24/2011    Procedure: TOTAL KNEE ARTHROPLASTY;  Surgeon: Shelda Pal;  Location: [facility];  Service: Orthopedics;  Laterality: Right;  with Femoral Nerve Block  . Femur im nail  07/16/2012    Procedure: INTRAMEDULLARY (IM) NAIL FEMORAL;  Surgeon: Shelda Pal, MD;  Location: [facility]S;  Service: Orthopedics;  Laterality: Left;  . Valve repalcement i  2012  . Esophagogastroduodenoscopy    . Colonoscopy    . Shoulder closed reduction Right 04/04/2013    Procedure: RIGHT SHOULDER CLOSED REDUCTION ;  Surgeon: Mable Paris, MD;  Location[facility];  Service: Orthopedics;  Laterality: Right;  . Reverse shoulder arthroplasty Right 04/04/2013    Procedure: VERSES REVERSE TOTAL SHOULDER REPLACEMENT ;  Surgeon: Mable Paris, MD;  Location: [facility] OR;  Service:  Orthopedics;  Laterality: Right;    SOCIAL HISTORY:  reports that she quit smoking about 33 years ago. She does not have any smokeless tobacco history on file. She reports that  drinks alcohol. She reports that she uses illicit drugs.  FAMILY HISTORY:  Family History  Problem Relation Age of Onset  . Cancer    . Diabetes      CURRENT MEDICATIONS: Reviewed per [facility]  REVIEW OF SYSTEMS:  See HPI otherwise 14 point ROS is negative.  PHYSICAL EXAMINATION  VS:  T 99.4       P 101      RR 16      BP 100/53       POX 91%        WT (Lb)  GENERAL: no acute distress, morbidly obese body habitus EYES: conjunctivae normal, sclerae normal, normal eye lids MOUTH/THROAT: lips without lesions,no lesions in the mouth,tongue is without lesions,uvula elevates in  midline NECK: supple, trachea midline, no neck masses, no thyroid tenderness, no thyromegaly LYMPHATICS: no LAN in the neck, no supraclavicular LAN RESPIRATORY: breathing is even & unlabored, BS CTAB CARDIAC: RRR, no murmur,no extra heart sounds, no edema GI:  ABDOMEN: abdomen soft, normal BS, no masses, no tenderness  LIVER/SPLEEN: no hepatomegaly, no splenomegaly MUSCULOSKELETAL: HEAD: normal to inspection & palpation BACK: no kyphosis, scoliosis or spinal processes tenderness EXTREMITIES: LEFT UPPER EXTREMITY: full range of motion, normal strength & tone RIGHT UPPER EXTREMITY: in a sling, strength decreased, range of motion unable to assess due to sling  LEFT LOWER EXTREMITY: strength decreased, range of motion minimal  RIGHT LOWER EXTREMITY: strength decreased, range of motion minimal  PSYCHIATRIC: the patient is alert & oriented to person, affect & behavior appropriate  LABS/RADIOLOGY: Hemoglobin 9.4, otherwise CBC normal.    BMP normal.    Right shoulder x-ray postoperatively showed well seated components of her total shoulder arthroplasty without complicating features.    Before surgery, right shoulder x-ray showed fracture and dislocation of the right proximal humerus.    ASSESSMENT/PLAN:  Right humerus fracture and dislocation.  Status post surgery.  Continue rehabilitation.   Hypertension.  Adequately controlled.  Acute blood loss anemia.  Reassess.   Depression.  Continue Zoloft.  Check CBC and BMP.   I have reviewed patient's medical records received at admission/from hospitalization.  CPT CODE: 16109

## 2013-05-05 ENCOUNTER — Non-Acute Institutional Stay (SKILLED_NURSING_FACILITY): Payer: Medicare HMO | Admitting: Adult Health

## 2013-05-05 DIAGNOSIS — I10 Essential (primary) hypertension: Secondary | ICD-10-CM

## 2013-05-05 DIAGNOSIS — K5900 Constipation, unspecified: Secondary | ICD-10-CM

## 2013-05-05 DIAGNOSIS — S4291XD Fracture of right shoulder girdle, part unspecified, subsequent encounter for fracture with routine healing: Secondary | ICD-10-CM

## 2013-05-05 DIAGNOSIS — S42309D Unspecified fracture of shaft of humerus, unspecified arm, subsequent encounter for fracture with routine healing: Secondary | ICD-10-CM

## 2013-05-05 DIAGNOSIS — D62 Acute posthemorrhagic anemia: Secondary | ICD-10-CM

## 2013-05-05 DIAGNOSIS — F329 Major depressive disorder, single episode, unspecified: Secondary | ICD-10-CM

## 2013-05-18 ENCOUNTER — Encounter: Payer: Self-pay | Admitting: Adult Health

## 2013-05-18 DIAGNOSIS — K5900 Constipation, unspecified: Secondary | ICD-10-CM | POA: Insufficient documentation

## 2013-05-18 NOTE — Progress Notes (Signed)
Patient ID: [name], female   DOB: [birth year], 78 y.o.   MRN: [id number]         PROGRESS NOTE  DATE: 05/05/2013   FACILITY: [facility]  LEVEL OF CARE: SNF (31)   CHIEF COMPLAINT:  Discharge Visit   HISTORY OF PRESENT ILLNESS: This is a 78 year old female was for discharge to an assisted living facility with home health PT and OT. DME: Wheelchair to a ceased with ADL care. She has been admitted to [facility] on 04/06/13 from [facility] with discharged diagnosis of fracture dislocation or shoulder joint status post right shoulder closed reduction versus reverse total shoulder replacement Patient was admitted to this facility for short-term rehabilitation after the patient's recent hospitalization.  Patient has completed SNF rehabilitation and therapy has cleared the patient for discharge.  Reassessment of ongoing problem(s): HTN: Pt 's HTN remains stable.  Denies CP, sob, DOE, pedal edema, headaches, dizziness or visual disturbances.  No complications from the medications currently being used.  Last BP : 118/59  DEPRESSION: The depression remains stable. Patient denies ongoing feelings of sadness, insomnia, anedhonia or lack of appetite. No complications reported from the medications currently being used. Staff do not report behavioral problems.   PAST MEDICAL HISTORY : Reviewed.  No changes.  CURRENT MEDICATIONS: Reviewed per [facility]  REVIEW OF SYSTEMS:  GENERAL: no change in appetite, no fatigue, no weight changes, no fever, chills or weakness RESPIRATORY: no cough, SOB, DOE, wheezing, hemoptysis CARDIAC: no chest pain, edema or palpitations GI: no abdominal pain, diarrhea, constipation, heart burn, nausea or vomiting  PHYSICAL EXAMINATION  VS:  T 98.5       P 95       RR 18      B 118/59 P      POX 95 %       WT 185.4 (Lb)  GENERAL: no acute distress, normal body habitus EYES: conjunctivae normal, sclerae normal, normal eye lids NECK: supple,  trachea midline, no neck masses, no thyroid tenderness, no thyromegaly LYMPHATICS: no LAN in the neck, no supraclavicular LAN RESPIRATORY: breathing is even & unlabored, BS CTAB CARDIAC: RRR, no murmur,no extra heart sounds, no edema GI: abdomen soft, normal BS, no masses, no tenderness, no hepatomegaly, no splenomegaly EXTREMITIES : limited ROM on RUE PSYCHIATRIC: the patient is alert & oriented to person, affect & behavior appropriate  LABS/RADIOLOGY: 04/14/13 WBC 6.0 hemoglobin 9.3 hematocrit 27.1 04/08/39 WBC 5.8 hemoglobin 8.7 hematocrit 35.9 BMP normal except calcium is 7.9 R. iron 17 vitamin B12 222 folate 3.8    ASSESSMENT/PLAN: Fracture  dislocation of right shoulder status post closed reduction versus reverse total shoulder replacement -  for home health PT and OT  Hypertension - well controlled   depression - stable  Anemia - stable  Constipation - stable     I have filled out patient's discharge paperwork and written prescriptions.  Patient will receive home health PT, OT, ST, nursing and CNA. DME provided:   Total discharge time: Greater than 30 minutes Discharge time involved coordination of the discharge process with social worker, nursing staff and therapy department. Medical justification for home health services/DME verified.  CPT CODE: 40981

## 2013-07-12 ENCOUNTER — Other Ambulatory Visit: Payer: Self-pay | Admitting: *Deleted

## 2013-07-12 MED ORDER — AMLODIPINE BESYLATE 10 MG PO TABS: 10.0000 mg | ORAL_TABLET | Freq: Every day | ORAL | Status: DC

## 2013-07-12 MED ORDER — SERTRALINE HCL 50 MG PO TABS: 50.0000 mg | ORAL_TABLET | Freq: Every day | ORAL | Status: DC

## 2013-08-10 ENCOUNTER — Other Ambulatory Visit: Payer: Self-pay | Admitting: *Deleted

## 2013-08-10 MED ORDER — AMLODIPINE BESYLATE 10 MG PO TABS: 10.0000 mg | ORAL_TABLET | Freq: Every day | ORAL | Status: AC

## 2013-08-10 MED ORDER — SERTRALINE HCL 50 MG PO TABS: 50.0000 mg | ORAL_TABLET | Freq: Every day | ORAL | Status: AC

## 2013-08-11 ENCOUNTER — Other Ambulatory Visit: Payer: Self-pay | Admitting: *Deleted

## 2013-10-28 ENCOUNTER — Emergency Department (HOSPITAL_COMMUNITY)
Admission: EM | Admit: 2013-10-28 | Discharge: 2013-10-28 | Disposition: A | Discharge status: Home / Self Care | Payer: Medicare HMO | Attending: Emergency Medicine | Admitting: Emergency Medicine

## 2013-10-28 ENCOUNTER — Encounter (HOSPITAL_COMMUNITY): Payer: Self-pay | Admitting: Emergency Medicine

## 2013-10-28 DIAGNOSIS — I10 Essential (primary) hypertension: Secondary | ICD-10-CM | POA: Insufficient documentation

## 2013-10-28 DIAGNOSIS — I359 Nonrheumatic aortic valve disorder, unspecified: Secondary | ICD-10-CM | POA: Insufficient documentation

## 2013-10-28 DIAGNOSIS — M1990 Unspecified osteoarthritis, unspecified site: Secondary | ICD-10-CM | POA: Insufficient documentation

## 2013-10-28 DIAGNOSIS — B369 Superficial mycosis, unspecified: Secondary | ICD-10-CM

## 2013-10-28 DIAGNOSIS — F329 Major depressive disorder, single episode, unspecified: Secondary | ICD-10-CM | POA: Insufficient documentation

## 2013-10-28 DIAGNOSIS — F3289 Other specified depressive episodes: Secondary | ICD-10-CM | POA: Insufficient documentation

## 2013-10-28 DIAGNOSIS — Z79899 Other long term (current) drug therapy: Secondary | ICD-10-CM | POA: Insufficient documentation

## 2013-10-28 DIAGNOSIS — B372 Candidiasis of skin and nail: Secondary | ICD-10-CM | POA: Insufficient documentation

## 2013-10-28 DIAGNOSIS — Z872 Personal history of diseases of the skin and subcutaneous tissue: Secondary | ICD-10-CM | POA: Insufficient documentation

## 2013-10-28 DIAGNOSIS — Z87891 Personal history of nicotine dependence: Secondary | ICD-10-CM | POA: Insufficient documentation

## 2013-10-28 DIAGNOSIS — G4733 Obstructive sleep apnea (adult) (pediatric): Secondary | ICD-10-CM | POA: Insufficient documentation

## 2013-10-28 DIAGNOSIS — Z96659 Presence of unspecified artificial knee joint: Secondary | ICD-10-CM | POA: Insufficient documentation

## 2013-10-28 DIAGNOSIS — Z880 Allergy status to penicillin: Secondary | ICD-10-CM | POA: Insufficient documentation

## 2013-10-28 MED ORDER — FLUCONAZOLE 200 MG PO TABS: 200.0000 mg | ORAL_TABLET | Freq: Every day | ORAL | Status: AC

## 2013-10-28 NOTE — ED Provider Notes (Signed)
CSN: [id number]     Arrival date & time 10/28/13  1656 History   First MD Initiated Contact with Patient 10/28/13 1658     Chief Complaint  Patient presents with  . Rash   (Consider location/radiation/quality/duration/timing/severity/associated sxs/prior Treatment) Patient is a 79 y.o. female presenting with rash. The history is provided by the patient and the EMS personnel.  Rash Associated symptoms: no abdominal pain, no diarrhea, no fever, no headaches, no shortness of breath, no sore throat and not vomiting   pt presents w erythematous area in creases of bilateral groin, and under bil breasts for past several months. No acute change today, but report of not getting better, slowly worse.  No fever or chills. Pt states feels fine/well otherwise. No nv. Eating normally. No abd pain. Pt unsure what is being used to tx area. States does get bathed 2x/week. Denies hx diabetes.     Past Medical History  Diagnosis Date  . Vitamin D deficiency   . OA (osteoarthritis)   . Morbid obesity   . Fracture of rib of left side   . Aortic stenosis   . Dyspnea     DYSPNEA WITH EXERTION  . Constipation     OCCASIONAL CONSTIPATION  . Hypertension     takes Amlodipine daily  . Joint pain   . Joint swelling   . Eczema   . Psoriasis   . Gastric ulcer   . Urinary frequency   . Urinary urgency   . Incontinence of urine   . Nocturia   . Depression     takes Zoloft daily  . Sleep apnea     doesn't use a cpap;used to until 66yr ago;sleep study done 7-71yrs ago   Past Surgical History  Procedure Laterality Date  . Cataract surgery    . Tonsillectomy    . Ovary removed secondary to cust    . 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 Tonsillectomy      as child  . Joint replacement      L TOTAL KNEE 05/2011  . Dental surgery      TEETH EXTRACTED  . Total knee arthroplasty  08/24/2011    Procedure: TOTAL KNEE ARTHROPLASTY;  Surgeon: Shelda Pal;  Location: [facility];  Service:  Orthopedics;  Laterality: Right;  with Femoral Nerve Block  . Femur im nail  07/16/2012    Procedure: INTRAMEDULLARY (IM) NAIL FEMORAL;  Surgeon: Shelda Pal, MD;  Location: [facility];  Service: Orthopedics;  Laterality: Left;  . Valve repalcement i  2012  . Esophagogastroduodenoscopy    . Colonoscopy    . Shoulder closed reduction Right 04/04/2013    Procedure: RIGHT SHOULDER CLOSED REDUCTION ;  Surgeon: Mable Paris, MD;  Location: [facility] OR;  Service: Orthopedics;  Laterality: Right;  . Reverse shoulder arthroplasty Right 04/04/2013    Procedure: VERSES REVERSE TOTAL SHOULDER REPLACEMENT ;  Surgeon: Mable Paris, MD;  Location: [facility] OR;  Service: Orthopedics;  Laterality: Right;   Family History  Problem Relation Age of Onset  . Cancer    . Diabetes     History  Substance Use Topics  . Smoking status: Former Smoker -- 25 years    Quit date: 10/20/1979  . Smokeless tobacco: Not on file  . Alcohol Use: Yes     Comment: casual drinker   OB History   Grav Para Term Preterm Abortions TAB SAB Ect Mult Living  Review of Systems  Constitutional: Negative for fever and chills.  HENT: Negative for sore throat.   Eyes: Negative for redness.  Respiratory: Negative for shortness of breath.   Cardiovascular: Negative for chest pain.  Gastrointestinal: Negative for vomiting, abdominal pain and diarrhea.  Genitourinary: Negative for flank pain.  Musculoskeletal: Negative for back pain and neck pain.  Skin: Positive for rash.  Neurological: Negative for headaches.  Hematological: Does not bruise/bleed easily.  Psychiatric/Behavioral: Negative for confusion.    Allergies  Penicillins  Home Medications   Current Outpatient Rx  Name  Route  Sig  Dispense  Refill  . amLODipine (NORVASC) 10 MG tablet   Oral   Take 1 tablet (10 mg total) by mouth daily.   30 tablet   3   . clotrimazole-betamethasone (LOTRISONE) cream   Topical   Apply 1 application  topically 2 (two) times daily.         Marland Kitchen. docusate sodium (COLACE) 100 MG capsule   Oral   Take 200 mg by mouth at bedtime as needed for mild constipation.         . sertraline (ZOLOFT) 50 MG tablet   Oral   Take 1 tablet (50 mg total) by mouth daily.   30 tablet   3    BP 134/66  Pulse 75  Temp(Src) 97.8 F (36.6 C) (Oral)  Resp 19  SpO2 92% Physical Exam  Nursing note and vitals reviewed. Constitutional: She appears well-developed and well-nourished. No distress.  HENT:  Mouth/Throat: Oropharynx is clear and moist.  Eyes: Conjunctivae are normal. No scleral icterus.  Neck: Neck supple. No tracheal deviation present.  Cardiovascular: Normal rate.   Pulmonary/Chest: Effort normal. No respiratory distress.  Abdominal: Soft. Normal appearance and bowel sounds are normal. She exhibits no distension and no mass. There is no tenderness. There is no rebound and no guarding.  Musculoskeletal: She exhibits no edema.  Neurological: She is alert.  Skin: Skin is warm and dry. Rash noted. She is not diaphoretic.  Yeast/fungal dermatitis in creases bil groin and under breasts. Skin in area moist. No cellulitis.   Psychiatric: She has a normal mood and affect.    ED Course  Procedures (including critical care time)  EKG Interpretation   None       MDM  Area cleaned. Clotrimazole cream.  rec pcp follow up w subsequent derm referral if fails to resolve.  No cellulitis. No fever.   Pt stable for d/c.   Reviewed nursing notes and prior charts for additional history.      Suzi RootsKevin E Kismet Facemire, MD 10/28/13 [phone]

## 2013-10-28 NOTE — ED Notes (Signed)
[facility] notified for transport 

## 2013-10-28 NOTE — ED Notes (Signed)
[facility]  Scripts with patient.

## 2013-10-28 NOTE — ED Notes (Signed)
[facility] EMS[facility]  Pt has had yeast skin infection since August.  Per facility, rash has increased up abdomen.  Hx HTN.  Vitals:  bp 162/90, ra 84, resp 16

## 2013-10-28 NOTE — ED Notes (Signed)
Bed: [id number] Expected date: 10/28/13 Expected time: 4:59 PM Means of arrival: Ambulance Comments: Yeast Infection

## 2013-10-28 NOTE — Discharge Instructions (Signed)
Keep area very clean and dry. Bath area daily (wash gently with warm water and mild, non perfumed soap such as dove), and then ensure area is dry - may consider blowing dry area with hair dryer on cool or warm setting.  Then apply very thin coat of clotrimazole cream.  You may also take fluconazole as prescribed. Follow up with primary care doctor in coming week for recheck - if fails to improve/resolve, discuss referral to dermatology then. Return to ER if worse, new symptoms, fevers, severe pain, spreading redness/cellulitis, other concern.     Candida Infection, Adult A candida infection (also called yeast, fungus and Monilia infection) is an overgrowth of yeast that can occur anywhere on the body. A yeast infection commonly occurs in warm, moist body areas. Usually, the infection remains localized but can spread to become a systemic infection. A yeast infection may be a sign of a more severe disease such as diabetes, leukemia, or AIDS. A yeast infection can occur in both men and women. In women, Candida vaginitis is a vaginal infection. It is one of the most common causes of vaginitis. Men usually do not have symptoms or know they have an infection until other problems develop. Men may find out they have a yeast infection because their sex partner has a yeast infection. Uncircumcised men are more likely to get a yeast infection than circumcised men. This is because the uncircumcised glans is not exposed to air and does not remain as dry as that of a circumcised glans. Older adults may develop yeast infections around dentures. CAUSES  Women  Antibiotics.  Steroid medication taken for a long time.  Being overweight (obese).  Diabetes.  Poor immune condition.  Certain serious medical conditions.  Immune suppressive medications for organ transplant patients.  Chemotherapy.  Pregnancy.  Menstration.  Stress and fatigue.  Intravenous drug use.  Oral contraceptives.  Wearing  tight-fitting clothes in the crotch area.  Catching it from a sex partner who has a yeast infection.  Spermicide.  Intravenous, urinary, or other catheters. Men  Catching it from a sex partner who has a yeast infection.  Having oral or anal sex with a person who has the infection.  Spermicide.  Diabetes.  Antibiotics.  Poor immune system.  Medications that suppress the immune system.  Intravenous drug use.  Intravenous, urinary, or other catheters. SYMPTOMS  Women  Thick, white vaginal discharge.  Vaginal itching.  Redness and swelling in and around the vagina.  Irritation of the lips of the vagina and perineum.  Blisters on the vaginal lips and perineum.  Painful sexual intercourse.  Low blood sugar (hypoglycemia).  Painful urination.  Bladder infections.  Intestinal problems such as constipation, indigestion, bad breath, bloating, increase in gas, diarrhea, or loose stools. Men  Men may develop intestinal problems such as constipation, indigestion, bad breath, bloating, increase in gas, diarrhea, or loose stools.  Dry, cracked skin on the penis with itching or discomfort.  Jock itch.  Dry, flaky skin.  Athlete's foot.  Hypoglycemia. DIAGNOSIS  Women  A history and an exam are performed.  The discharge may be examined under a microscope.  A culture may be taken of the discharge. Men  A history and an exam are performed.  Any discharge from the penis or areas of cracked skin will be looked at under the microscope and cultured.  Stool samples may be cultured. TREATMENT  Women  Vaginal antifungal suppositories and creams.  Medicated creams to decrease irritation and itching on  the outside of the vagina.  Warm compresses to the perineal area to decrease swelling and discomfort.  Oral antifungal medications.  Medicated vaginal suppositories or cream for repeated or recurrent infections.  Wash and dry the irritation areas before  applying the cream.  Eating yogurt with lactobacillus may help with prevention and treatment.  Sometimes painting the vagina with gentian violet solution may help if creams and suppositories do not work. Men  Antifungal creams and oral antifungal medications.  Sometimes treatment must continue for 30 days after the symptoms go away to prevent recurrence. HOME CARE INSTRUCTIONS  Women  Use cotton underwear and avoid tight-fitting clothing.  Avoid colored, scented toilet paper and deodorant tampons or pads.  Do not douche.  Keep your diabetes under control.  Finish all the prescribed medications.  Keep your skin clean and dry.  Consume milk or yogurt with lactobacillus active culture regularly. If you get frequent yeast infections and think that is what the infection is, there are over-the-counter medications that you can get. If the infection does not show healing in 3 days, talk to your caregiver.  Tell your sex partner you have a yeast infection. Your partner may need treatment also, especially if your infection does not clear up or recurs. Men  Keep your skin clean and dry.  Keep your diabetes under control.  Finish all prescribed medications.  Tell your sex partner that you have a yeast infection so they can be treated if necessary. SEEK MEDICAL CARE IF:   Your symptoms do not clear up or worsen in one week after treatment.  You have an oral temperature above 102 F (38.9 C).  You have trouble swallowing or eating for a prolonged time.  You develop blisters on and around your vagina.  You develop vaginal bleeding and it is not your menstrual period.  You develop abdominal pain.  You develop intestinal problems as mentioned above.  You get weak or lightheaded.  You have painful or increased urination.  You have pain during sexual intercourse. MAKE SURE YOU:   Understand these instructions.  Will watch your condition.  Will get help right away if you  are not doing well or get worse. Document Released: 11/12/2004 Document Revised: 12/28/2011 Document Reviewed: 02/24/2010 [facility] Patient Information 2014 [employer] [place].

## 2021-04-18 DEATH — deceased
